# Patient Record
Sex: Male | Born: 1980 | Race: Black or African American | Hispanic: No | Marital: Single | Smoking: Never smoker
Health system: Southern US, Community
[De-identification: ages and names within clinical notes are randomized; demographics above are authoritative.]

## PROBLEM LIST (undated history)

## (undated) DIAGNOSIS — E7800 Pure hypercholesterolemia, unspecified: Secondary | ICD-10-CM

## (undated) DIAGNOSIS — R6250 Unspecified lack of expected normal physiological development in childhood: Secondary | ICD-10-CM

## (undated) DIAGNOSIS — J45909 Unspecified asthma, uncomplicated: Secondary | ICD-10-CM

## (undated) HISTORY — DX: Unspecified asthma, uncomplicated: J45.909

## (undated) HISTORY — PX: OTHER SURGICAL HISTORY: SHX169

## (undated) HISTORY — DX: Unspecified lack of expected normal physiological development in childhood: R62.50

## (undated) HISTORY — DX: Pure hypercholesterolemia, unspecified: E78.00

---

## 2001-11-18 ENCOUNTER — Ambulatory Visit (HOSPITAL_BASED_OUTPATIENT_CLINIC_OR_DEPARTMENT_OTHER): Admission: RE | Admit: 2001-11-18 | Discharge: 2001-11-18 | Payer: Self-pay | Admitting: Oral Surgery

## 2005-12-03 ENCOUNTER — Ambulatory Visit: Payer: Self-pay | Admitting: Internal Medicine

## 2005-12-31 ENCOUNTER — Ambulatory Visit: Payer: Self-pay | Admitting: Internal Medicine

## 2006-10-31 ENCOUNTER — Emergency Department (HOSPITAL_COMMUNITY): Admission: EM | Admit: 2006-10-31 | Discharge: 2006-10-31 | Payer: Self-pay | Admitting: Emergency Medicine

## 2007-05-21 ENCOUNTER — Ambulatory Visit (HOSPITAL_COMMUNITY): Admission: RE | Admit: 2007-05-21 | Discharge: 2007-05-21 | Payer: Self-pay | Admitting: General Surgery

## 2010-10-09 ENCOUNTER — Emergency Department (HOSPITAL_COMMUNITY)
Admission: EM | Admit: 2010-10-09 | Discharge: 2010-10-09 | Payer: Self-pay | Source: Home / Self Care | Admitting: Emergency Medicine

## 2010-10-16 ENCOUNTER — Other Ambulatory Visit: Payer: Self-pay | Admitting: Family Medicine

## 2010-10-16 DIAGNOSIS — R569 Unspecified convulsions: Secondary | ICD-10-CM

## 2010-10-16 DIAGNOSIS — R55 Syncope and collapse: Secondary | ICD-10-CM

## 2010-10-18 ENCOUNTER — Other Ambulatory Visit: Payer: Self-pay

## 2011-01-28 NOTE — Op Note (Signed)
NAMEDEAVEN, Aaron Campbell                ACCOUNT NO.:  [id number]   MEDICAL RECORD NO.:  [id number]          PATIENT TYPE:  AMB   LOCATION:  DAY                          FACILITY:  [facility]   PHYSICIAN:  Timothy E. Earlene Plater, M.D. DATE OF BIRTH:  Nov 17, 1980   DATE OF PROCEDURE:  05/21/2007  DATE OF DISCHARGE:                               OPERATIVE REPORT   PREOPERATIVE DIAGNOSIS:  Primary ventral hernia, umbilical hernia.   POSTOPERATIVE DIAGNOSIS:  Primary ventral hernia, umbilical hernia.   PROCEDURE:  Laparoscopic repair of ventral hernia with mesh.   SURGEON:  Timothy E. Earlene Plater, M.D.   ASSISTANT:  Alfonse Ras, MD   ANESTHESIA:  General.   Aaron Campbell is 42.  He is a Down syndrome patient, lives at home, takes  care of himself and works every day.  He is slightly overweight. He has  been complaining of abdominal pain and a significant ventral hernia was  diagnosed finally after several visits.  He has been carefully prepped,  his parents prepared.  They are ready to proceed at this time.  When  examined in the office it was thought he also had an umbilical hernia.   The patient was seen, identified and permit signed.   He was taken to the operating room and placed supine.  General  endotracheal anesthesia administered.  The arms were carefully padded  and tucked.  The abdomen was prepped in its entirety and draped as  sterile field.  I entered the abdominal cavity and the left upper  quadrant with a 10 mm scope and entered the abdominal cavity without  complications.  It was insufflated and general peritoneoscopy was  unremarkable except for a significant umbilical hernia and approximately  3-4 cm the ventral hernia with one large strand of omentum incarcerated.  A second 5 mm trocar was placed in the left lower quadrant and the  omentum was taken down sharply with cautery.  The hernia then could be  easily seen.  They were carefully measured by inserting spinal needles  through  the skin.  This was measured, marked on the outside skin and a  piece of 12 cm circular Ventralex mesh was chosen.  This was placed on  the abdominal wall seemed to estimate fit nicely.  Four sutures of  Prolene were placed in each quadrant of the mesh and then the mesh was  wetted.  It was then rolled up, placed into the abdominal cavity  unrolled and oriented.  The Prolene sutures were brought through the  extremities of the hernia with a suture placer and again carefully  oriented.  The sutures were all pulled up at the same time carrying the  mesh to the anterior abdominal wall and these were tied snugly.  Then  the tacking device was used to complete the mesh placement and the  tacker was used round about the circumference of the mesh.  We did have  to add additional 5 mm ports in the right lower and right upper  quadrants as well.  Mesh was in good position, seemed to be well placed.  This procedure was complete.  All instruments and  trocars removed.  Gas removed from the abdominal cavity.  Then each  wound inspected, closed where needed with interrupted suture.  Final  counts correct.  He tolerated it well, was taken recovery room in good  condition.  Written and verbal instructions given him and his family.  He will be followed in the office.      Timothy E. Earlene Plater, M.D.  Electronically Signed     TED/MEDQ  D:  05/21/2007  T:  05/21/2007  Job:  [id number]   cc:   [facility]

## 2011-01-31 NOTE — Discharge Summary (Signed)
. [facility]  Patient:    Aaron Campbell, Aaron Campbell Visit Number: [id number] MRN: [id number]          Service Type: DSU Location: [facility] Attending Physician:  Leonie Man Dictated by:   Dora Sims, M.D. Admit Date:  11/18/2001                             Discharge Summary  PREOPERATIVE DIAGNOSIS:  Impacted third molars, impacted unerupted tooth #22.  POSTOPERATIVE DIAGNOSIS:  Impacted third molars, impacted unerupted tooth #22.  PROCEDURE:  Surgical extraction of teeth #1, 16, and 17, exposure and bonding of tooth #22.  SURGEON:  Dora Sims, M.D.  ANESTHESIA:  General endotracheal tube anesthesia.  BRIEF HISTORY:  This is a Down syndrome patient with macroglossia.  Conscious IV sedation was attempted in the office setting and due to airway management, the decision was made to limit the surgical procedure at that time and defer for intubated general anesthesia.  DESCRIPTION OF PROCEDURE:  The patient was maintained NPO the night before surgery, was brought to the operating suite, placed in the supine position. All anesthesia monitors were found to be working appropriately.  The patient was orotracheally intubated with minimal difficulty, confirmed by clear bilateral breath sounds as well as positive end-tidal CO2.  The patient was appropriately padded.  He was then draped in a clean fashion for a dental procedure.  Approximately 8 cc of 2% lidocaine with 1:100,000 parts epinephrine were injected into the teeth #1 and 16 areas for infiltration and a mandibular block on the lower left to anesthetize teeth #17 and 22.  After adequate time for vasoconstriction was allowed, a 15 Bard Jimmey Ralph was used to release a distal flap elevation to expose teeth #1 and 16.  They were exposed, luxated, and delivered with forceps.  The follicles were removed with a rongeur.  Now the flaps were then closed with 3-0 chromic gut suture.  A distal release was also  performed in the #17 area.  A full-thickness mucoperiosteal flap was elevated, exposing the area of tooth #17.  The tooth was not visible.  An irrigating handpiece was then used to unroof the occlusal and buccal bone, then exposing the tooth.  The tooth was then sectioned and delivered in multiple pieces.  The socket was curetted, the follicle was removed and irrigated.  It was closed with 3-0 chromic gut suture as well. Attention was then focused at tooth #22.  A small envelope gingival flap was developed to expose the facial aspect of tooth #22.  It was etched, glass ionomer bonding material was then used to bond a stainless steel bracket and chain.  The chain was then cut to size, tied to the arch wire, and two interdental papillae sutures were placed with 3-0 chromic gut suture.  The throat pack that was placed at the beginning of the case was removed at this time.  There was minimal to no bleeding from the wisdom teeth sites, and minimal to no bleeding at the exposed bond site.  The patient was extubated, allowed to awake from a general anesthetic.  He will be maintained on a soft diet, followed up in my office.  No antibiotics will be given.  Hydrocodone will be used for pain management, and will be seen in my office for a normal postoperative visit. Dictated by:   Dora Sims, M.D. Attending Physician:  Leonie Man DD:  11/18/01 TD:  11/19/01 Job: [id number]

## 2011-06-27 LAB — COMPREHENSIVE METABOLIC PANEL
ALT: 21
AST: 18
Calcium: 9
Creatinine, Ser: 1
GFR calc Af Amer: >60
GFR calc non Af Amer: >60
Glucose, Bld: 89
Sodium: 139
Total Protein: 7.5

## 2011-06-27 LAB — CBC
MCHC: 33.9
MCV: 88.1
RDW: 15.6 — ABNORMAL HIGH

## 2011-06-27 LAB — DIFFERENTIAL
Eosinophils Absolute: 0.2
Lymphocytes Relative: 25
Lymphs Abs: 1.1
Monocytes Relative: 11
Neutrophils Relative %: 59

## 2013-10-19 ENCOUNTER — Ambulatory Visit (INDEPENDENT_AMBULATORY_CARE_PROVIDER_SITE_OTHER): Payer: Medicaid Other

## 2013-10-19 VITALS — BP 129/80 | HR 84 | Resp 12

## 2013-10-19 DIAGNOSIS — Q828 Other specified congenital malformations of skin: Secondary | ICD-10-CM

## 2013-10-19 DIAGNOSIS — B351 Tinea unguium: Secondary | ICD-10-CM

## 2013-10-19 DIAGNOSIS — M79609 Pain in unspecified limb: Secondary | ICD-10-CM

## 2013-10-19 MED ORDER — EFINACONAZOLE 10 % EX SOLN: CUTANEOUS | Status: DC

## 2013-10-19 NOTE — Progress Notes (Signed)
   Subjective:    Patient ID: Aaron Campbell, male    DOB: 05/16/1981, 33 y.o.   MRN: [id number]  HPI'' B/L TOENAILS ARE THICK AND HAVE FUNGUS FOR OVER 1 YEAR. TREATMENT TRIED NAFTIN IS NOT WORKING.''    Review of Systems  All other systems reviewed and are negative.       Objective:   Physical Exam Fluctuating objective findings as follows vascular status is intact with pedal pulses palpable DP postal for bilateral PT plus one over 4 bilateral. Patient is in generally dry skin and nails thick brittle crumbly discolored tender darkened and incurvated 1 through 5 bilateral hallux nails being the most severe. Nails are tender to touch and palpation. Patient also some flexible digital contractures and hammertoe deformities slightly plantigrade metatarsals subsecond third with a diffuse keratoses being noted as well. No punctate keratoses no open wounds ulcerations noted patient tried formula 3 in the past although did not follow through on this osseous Naftin cream for the chronic tinea pedis which seems to improved. At this time caregiver present with the patient is interested in a chair for alternative medication. Patient's stat medication cannot take oral antifungal medications due to drug interaction therefore the only option is to maintain a topical suggested the new Jublia medication.      Assessment & Plan:  Assessment onychomycosis painful mycotic dystrophic nails 1 through 5 bilateral debridement at this time also prescribed Jubilee a with instructions for use utilizing the [facility] pharmacy to provide a medication to. Followup in 3-4 months for continued palliative nail care and debridement as needed advised to stressed the importance of daily application the medication to each nail for 12 months duration  Alvan Dameichard Marvelle Caudill DPM

## 2013-10-19 NOTE — Patient Instructions (Signed)
Onychomycosis/Fungal Toenails  WHAT IS IT? An infection that lies within the keratin of your nail plate that is caused by a fungus.  WHY ME? Fungal infections affect all ages, sexes, races, and creeds.  There may be many factors that predispose you to a fungal infection such as age, coexisting medical conditions such as diabetes, or an autoimmune disease; stress, medications, fatigue, genetics, etc.  Bottom line: fungus thrives in a warm, moist environment and your shoes offer such a location.  IS IT CONTAGIOUS? Theoretically, yes.  You do not want to share shoes, nail clippers or files with someone who has fungal toenails.  Walking around barefoot in the same room or sleeping in the same bed is unlikely to transfer the organism.  It is important to realize, however, that fungus can spread easily from one nail to the next on the same foot.  HOW DO WE TREAT THIS?  There are several ways to treat this condition.  Treatment may depend on many factors such as age, medications, pregnancy, liver and kidney conditions, etc.  It is best to ask your doctor which options are available to you.  1. No treatment.   Unlike many other medical concerns, you can live with this condition.  However for many people this can be a painful condition and may lead to ingrown toenails or a bacterial infection.  It is recommended that you keep the nails cut short to help reduce the amount of fungal nail. 2. Topical treatment.  These range from herbal remedies to prescription strength nail lacquers.  About 40-50% effective, topicals require twice daily application for approximately 9 to 12 months or until an entirely new nail has grown out.  The most effective topicals are medical grade medications available through physicians offices. 3. Oral antifungal medications.  With an 80-90% cure rate, the most common oral medication requires 3 to 4 months of therapy and stays in your system for a year as the new nail grows out.  Oral  antifungal medications do require blood work to make sure it is a safe drug for you.  A liver function panel will be performed prior to starting the medication and after the first month of treatment.  It is important to have the blood work performed to avoid any harmful side effects.  In general, this medication safe but blood work is required. 4. Laser Therapy.  This treatment is performed by applying a specialized laser to the affected nail plate.  This therapy is noninvasive, fast, and non-painful.  It is not covered by insurance and is therefore, out of pocket.  The results have been very good with a 80-95% cure rate.  The [facility] is the only practice in the area to offer this therapy. 5. Permanent Nail Avulsion.  Removing the entire nail so that a new nail will not grow back.  Jublia topical nail antifungal solution as prescribed at this time. Instructions are to apply the Margette FastJubilee a to each affected toenails daily for 12 months. There are no shortcuts this has 3 applied every day to each nail as instructed discontinue only if any skin or nail irritation occur. He takes a minimum of 12 months to clear fungus in the nail.

## 2015-01-12 ENCOUNTER — Ambulatory Visit
Admission: RE | Admit: 2015-01-12 | Discharge: 2015-01-12 | Disposition: A | Discharge status: Home / Self Care | Payer: 59 | Source: Ambulatory Visit | Attending: Family Medicine | Admitting: Family Medicine

## 2015-01-12 ENCOUNTER — Other Ambulatory Visit: Payer: Self-pay | Admitting: Family Medicine

## 2015-01-12 DIAGNOSIS — R2689 Other abnormalities of gait and mobility: Secondary | ICD-10-CM

## 2015-01-18 ENCOUNTER — Other Ambulatory Visit: Payer: Self-pay | Admitting: Family Medicine

## 2015-01-18 DIAGNOSIS — R2689 Other abnormalities of gait and mobility: Secondary | ICD-10-CM

## 2015-02-16 ENCOUNTER — Other Ambulatory Visit: Payer: 59

## 2015-06-05 ENCOUNTER — Ambulatory Visit: Payer: 59 | Admitting: Physical Therapy

## 2015-06-11 ENCOUNTER — Ambulatory Visit: Payer: 59 | Admitting: Physical Therapy

## 2015-06-19 ENCOUNTER — Encounter: Payer: Self-pay | Admitting: Physical Therapy

## 2015-06-19 ENCOUNTER — Ambulatory Visit: Payer: 59 | Attending: Orthopedic Surgery | Admitting: Physical Therapy

## 2015-06-19 DIAGNOSIS — R262 Difficulty in walking, not elsewhere classified: Secondary | ICD-10-CM | POA: Insufficient documentation

## 2015-06-19 DIAGNOSIS — M5126 Other intervertebral disc displacement, lumbar region: Secondary | ICD-10-CM | POA: Diagnosis present

## 2015-06-19 DIAGNOSIS — R29898 Other symptoms and signs involving the musculoskeletal system: Secondary | ICD-10-CM | POA: Insufficient documentation

## 2015-06-19 NOTE — Patient Instructions (Signed)
Back Hyperextension: Using Arms   Lying face down with arms bent, inhale. Then while exhaling, straighten arms. Hold __2__ seconds. Let hips sag toward floor.  Slowly return to starting position. Repeat _10___ times per set. Do _2___ sets per session. Do __2__ sessions per day.  Back Namaste   Stand, feet in stride stance. Rotate back leg out about 20, keeping heel on floor. Stand, hands on low back. Press hips forward and arch back. Hold _2__ seconds. Repeat 10___ times per session. Do _2__ sessions per day.  Bracing With Bridging (Hook-Lying)   With neutral spine, tighten pelvic floor and abdominals and hold. Lift bottom. Repeat _10__ times. Do _2__ times a day.       

## 2015-06-19 NOTE — Therapy (Signed)
[facility] [address] [place], [place], [address] Phone: [phone]   Fax:  [phone]  Physical Therapy Evaluation  Patient Details  Name: Aaron Campbell MRN: [id number] Date of Birth: Apr 07, 1981 Referring Provider:  Venita Lick, MD  Encounter Date: 06/19/2015      PT End of Session - 06/19/15 1143    Visit Number 1   Date for PT Re-Evaluation 08/19/15   PT Start Time 1057   PT Stop Time 1143   PT Time Calculation (min) 46 min   Activity Tolerance Patient tolerated treatment well   Behavior During Therapy [facility] for tasks assessed/performed      Past Medical History  Diagnosis Date  . Asthma     History reviewed. No pertinent past surgical history.  There were no vitals filed for this visit.  Visit Diagnosis:  Right leg weakness - Plan: PT plan of care cert/re-cert  Difficulty walking - Plan: PT plan of care cert/re-cert  HNP (herniated nucleus pulposus), lumbar - Plan: PT plan of care cert/re-cert      Subjective Assessment - 06/19/15 1108    Subjective Mom reports that patient has been limping on the right for about a year.  Patient has Down's syndrome.  Mother is the primary historian, she reports that he has a high tolerance of pain and does not c/o pain.  She reports that he started to decrease the distance he walked due to c/o some pain   Limitations Walking   Diagnostic tests X-rays and MRI showed some disc bulging   Currently in Pain? Yes   Pain Score 4   difficulty getting, information from him   Pain Location Back   Pain Orientation Right   Pain Descriptors / Indicators Aching   Pain Type Chronic pain   Pain Onset More than a month ago   Pain Frequency Intermittent   Aggravating Factors  walking   Pain Relieving Factors rest   Effect of Pain on Daily Activities difficulty walking            [facility] PT Assessment - 06/19/15 0001    Assessment   Medical Diagnosis back and hip  pain, difficulty walking   Onset Date/Surgical Date 01/17/15   Prior Therapy n   Precautions   Precautions None   Balance Screen   Has the patient fallen in the past 6 months Yes   How many times? 2   Has the patient had a decrease in activity level because of a fear of falling?  No   Is the patient reluctant to leave their home because of a fear of falling?  No   Home Environment   Additional Comments stairs, some yardwork   Prior Function   Level of Independence Independent   Leisure walked for exercise up until 5 months ago   AROM   Overall AROM Comments decreased 25% for lumbar ROM, hip and knee ROM is WFL's   Strength   Overall Strength Comments right LE 3/5, left LE 4-/5   Palpation   Palpation comment significant mm mass loss on the right LE, circumferential measurements, right mid calf 37cm, left mid calf 41cm, right mid thigh 54 cm, left 59 cm                   [facility] Adult PT Treatment/Exercise - 06/19/15 0001    Ambulation/Gait   Gait Comments no device, slight right toe drag, does not bend the right knee, circumducts  the right leg                  PT Short Term Goals - 06/19/15 1148    PT SHORT TERM GOAL #1   Title independent with initial HEP   Time 2   Period Weeks   Status New           PT Long Term Goals - 06/19/15 1149    PT LONG TERM GOAL #1   Title increase right DF strength to 4/5   Time 8   Period Weeks   Status New   PT LONG TERM GOAL #2   Title walk 1 mile   Time 8   Period Weeks   Status New   PT LONG TERM GOAL #3   Title increase lumbar ROM 25%   Time 8   Period Weeks   Status New   PT LONG TERM GOAL #4   Title decreased circumduction of the right leg with gait   Time 8   Period Weeks   Status New   PT LONG TERM GOAL #5   Title increase strength of the right quad to 4/5   Time 8   Period Weeks   Status New               Plan - 06/19/15 1144    Clinical Impression Statement Patient with limping  for a year, with decreased ability to walk.  Mom reports to me that an MRI showed a bulging disc with a pinched nerve.  He has a slight foot drop on the right, with decreased knee flexion and with right hip circumduction.  The biggest thing that jumps out to me is pretty significant mm atrophy of the right calf and right thigh.  He does not c/o pain, the mom reports he has a high tolerance to pain.  He has Down's syndrome and the mom was mostly the historian for me.   Pt will benefit from skilled therapeutic intervention in order to improve on the following deficits Abnormal gait;Difficulty walking;Decreased range of motion;Decreased strength;Impaired flexibility;Pain   Rehab Potential Good   PT Frequency 2x / week   PT Duration 8 weeks   PT Treatment/Interventions Moist Heat;Electrical Stimulation;Gait training;Traction;Therapeutic activities;Therapeutic exercise;Manual techniques;Patient/family education;Passive range of motion   PT Next Visit Plan add exercises as tolerated   Consulted and Agree with Plan of Care Patient         Problem List There are no active problems to display for this patient.   Jearld Lesch., PT 06/19/2015, 12:05 PM  [facility] [address] [place], [place], [address] Phone: [phone]   Fax:  [phone]

## 2015-08-01 ENCOUNTER — Encounter: Payer: Self-pay | Admitting: Physical Therapy

## 2015-08-01 ENCOUNTER — Ambulatory Visit: Payer: 59 | Attending: Orthopedic Surgery | Admitting: Physical Therapy

## 2015-08-01 DIAGNOSIS — R262 Difficulty in walking, not elsewhere classified: Secondary | ICD-10-CM | POA: Diagnosis not present

## 2015-08-01 DIAGNOSIS — R29898 Other symptoms and signs involving the musculoskeletal system: Secondary | ICD-10-CM

## 2015-08-01 DIAGNOSIS — M5126 Other intervertebral disc displacement, lumbar region: Secondary | ICD-10-CM | POA: Diagnosis present

## 2015-08-01 NOTE — Therapy (Signed)
[facility] [address] [place], [place], [address] Phone: [phone]   Fax:  [phone]  Physical Therapy Treatment  Patient Details  Name: Aaron Campbell MRN: [id number] Date of Birth: 1981-08-13 No Data Recorded  Encounter Date: 08/01/2015      PT End of Session - 08/01/15 0925    Visit Number 2   Date for PT Re-Evaluation 08/19/15   PT Start Time 0848   PT Stop Time 0932   PT Time Calculation (min) 44 min      Past Medical History  Diagnosis Date  . Asthma     History reviewed. No pertinent past surgical history.  There were no vitals filed for this visit.  Visit Diagnosis:  Difficulty walking  HNP (herniated nucleus pulposus), lumbar  Right leg weakness      Subjective Assessment - 08/01/15 0849    Subjective Pt mom reports that he fell last week. Difficulty with stairs    Currently in Pain? No/denies   Pain Score 0-No pain                         [facility] Adult PT Treatment/Exercise - 08/01/15 0001    Exercises   Exercises Knee/Hip;Lumbar   Knee/Hip Exercises: Aerobic   Nustep NuStep L^ x 7 minutes    Knee/Hip Exercises: Machines for Strengthening   Cybex Knee Extension ##10 2x10    Cybex Leg Press #40 3x10   Knee/Hip Exercises: Seated   Long Arc Quad Both;2 sets;10 reps;Weights   Long Arc Quad Weight 3 lbs.   Marching 2 sets;Both;10 reps;Weights   Marching Weights 3 lbs.   Abduction/Adduction  2 sets;10 reps   Abd/Adduction Limitations ball squeezes, red Tband  2x15    Sit to Sand 10 reps;without UE support;2 sets  yellow weighted ball                   PT Short Term Goals - 06/19/15 1148    PT SHORT TERM GOAL #1   Title independent with initial HEP   Time 2   Period Weeks   Status New           PT Long Term Goals - 06/19/15 1149    PT LONG TERM GOAL #1   Title increase right DF strength to 4/5   Time 8   Period Weeks   Status New   PT  LONG TERM GOAL #2   Title walk 1 mile   Time 8   Period Weeks   Status New   PT LONG TERM GOAL #3   Title increase lumbar ROM 25%   Time 8   Period Weeks   Status New   PT LONG TERM GOAL #4   Title decreased circumduction of the right leg with gait   Time 8   Period Weeks   Status New   PT LONG TERM GOAL #5   Title increase strength of the right quad to 4/5   Time 8   Period Weeks   Status New               Plan - 08/01/15 0933    Clinical Impression Statement Pt progressed to to gym level exercises. Pt hypo verbal, mom does most of the talking. Mom reports that pt has difficulty going up ands down stairs. today's interventions focused on LE strengthening able to complete all interventions put cues for pacing.   Pt  will benefit from skilled therapeutic intervention in order to improve on the following deficits Abnormal gait;Difficulty walking;Decreased range of motion;Decreased strength;Impaired flexibility;Pain   Rehab Potential Good   PT Frequency 2x / week   PT Duration 8 weeks   PT Treatment/Interventions Moist Heat;Electrical Stimulation;Gait training;Traction;Therapeutic activities;Therapeutic exercise;Manual techniques;Patient/family education;Passive range of motion   PT Next Visit Plan add exercises as tolerated        Problem List There are no active problems to display for this patient.   Grayce Sessionsonald G Mirela Parsley, PTA  08/01/2015, 9:38 AM  [facility] [address] [place], [place], [address] Phone: [phone]   Fax:  [phone]  Name: Aaron Campbell MRN: [id number] Date of Birth: [birth year]

## 2015-08-07 ENCOUNTER — Encounter: Payer: Self-pay | Admitting: Physical Therapy

## 2015-08-07 ENCOUNTER — Ambulatory Visit: Payer: 59 | Admitting: Physical Therapy

## 2015-08-07 DIAGNOSIS — R262 Difficulty in walking, not elsewhere classified: Secondary | ICD-10-CM

## 2015-08-07 DIAGNOSIS — M5126 Other intervertebral disc displacement, lumbar region: Secondary | ICD-10-CM

## 2015-08-07 DIAGNOSIS — R29898 Other symptoms and signs involving the musculoskeletal system: Secondary | ICD-10-CM

## 2015-08-07 NOTE — Therapy (Signed)
[facility] [place] [facility] [address] [place], [place], [address] Phone: [phone]   Fax:  [phone]  Physical Therapy Treatment  Patient Details  Name: Aaron Campbell MRM: [id number] Date of Birth: 12-09-1980 No Data Recorded  Encounter Date: 08/07/2015      PT End of Session - 08/07/15 1058    Visit Number 3   Date for PT Re-Evaluation 08/19/15   PT Start Time 1024   PT Stop Time 1058   PT Time Calculation (min) 34 min      Past Medical History  Diagnosis Date  . Asthma     History reviewed. No pertinent past surgical history.  There were no vitals filed for this visit.  Visit Diagnosis:  HNP (herniated nucleus pulposus), lumbar  Right leg weakness  Difficulty walking      Subjective Assessment - 08/07/15 1024    Subjective Pt reports no problems   Currently in Pain? No/denies   Pain Score 0-No pain                         [facility] Adult PT Treatment/Exercise - 08/07/15 0001    Ambulation/Gait   Gait Comments Able to negotiate 3 flights of stairs 2 rails on descends one with ascends alternating pattern. Cues required to maintain alternating pattern with descending    Lumbar Exercises: Aerobic   Stationary Bike L0 x6 min    Knee/Hip Exercises: Machines for Strengthening   Cybex Knee Extension #15 2x10    Cybex Knee Flexion #25 2x10    Cybex Leg Press #40 3x10   Knee/Hip Exercises: Standing   Other Standing Knee Exercises Standing march #5 x10    Knee/Hip Exercises: Seated   Long Arc Quad Right;2 sets;10 reps;Weights   Long Arc Quad Weight 5 lbs.                  PT Short Term Goals - 08/07/15 1101    PT SHORT TERM GOAL #1   Title independent with initial HEP   Status On-going           PT Long Term Goals - 06/19/15 1149    PT LONG TERM GOAL #1   Title increase right DF strength to 4/5   Time 8   Period Weeks   Status New   PT LONG TERM GOAL #2   Title walk 1  mile   Time 8   Period Weeks   Status New   PT LONG TERM GOAL #3   Title increase lumbar ROM 25%   Time 8   Period Weeks   Status New   PT LONG TERM GOAL #4   Title decreased circumduction of the right leg with gait   Time 8   Period Weeks   Status New   PT LONG TERM GOAL #5   Title increase strength of the right quad to 4/5   Time 8   Period Weeks   Status New               Plan - 08/07/15 1058    Clinical Impression Statement Pt and his mom 9 minutes late for PT treatment. Pt able to progress with resistance and functional mobility. PT able to progress to stair negotiation with alternating pattern. Pt with downs syndrome and very flat affect. Answers questions and completes all interventions.    Pt will benefit from skilled therapeutic intervention in order to improve  on the following deficits Abnormal gait;Difficulty walking;Decreased range of motion;Decreased strength;Impaired flexibility;Pain   Rehab Potential Good   PT Frequency 2x / week   PT Duration 8 weeks   PT Treatment/Interventions Moist Heat;Electrical Stimulation;Gait training;Traction;Therapeutic activities;Therapeutic exercise;Manual techniques;Patient/family education;Passive range of motion   PT Next Visit Plan add exercises as tolerated        Problem List There are no active problems to display for this patient.   Grayce Sessionsonald G Elius Etheredge, PTA  08/07/2015, 11:02 AM  [facility] [place] [facility] [address] [place], [place], [address] Phone: [phone]   Fax:  [phone]  Name: Aaron Campbell MRN: [id number] Date of Birth: 1981-08-11

## 2015-08-14 ENCOUNTER — Ambulatory Visit: Payer: 59 | Admitting: Physical Therapy

## 2015-08-16 ENCOUNTER — Ambulatory Visit: Payer: 59 | Attending: Orthopedic Surgery | Admitting: Physical Therapy

## 2015-08-16 ENCOUNTER — Encounter: Payer: Self-pay | Admitting: Physical Therapy

## 2015-08-16 DIAGNOSIS — R29898 Other symptoms and signs involving the musculoskeletal system: Secondary | ICD-10-CM | POA: Diagnosis present

## 2015-08-16 DIAGNOSIS — R262 Difficulty in walking, not elsewhere classified: Secondary | ICD-10-CM | POA: Diagnosis present

## 2015-08-16 DIAGNOSIS — M5126 Other intervertebral disc displacement, lumbar region: Secondary | ICD-10-CM | POA: Diagnosis present

## 2015-08-16 NOTE — Therapy (Signed)
[facility] [place] [address] [place], [place], [address] Phone: [phone]   Fax:  [phone]  Physical Therapy Treatment  Patient Details  Name: Aaron Campbell MRN: [id number] Date of Birth: October 13, 1980 No Data Recorded  Encounter Date: 08/16/2015      PT End of Session - 08/16/15 1144    Visit Number 4   Date for PT Re-Evaluation 08/19/15   PT Start Time 1106   PT Stop Time 1145   PT Time Calculation (min) 39 min      Past Medical History  Diagnosis Date  . Asthma     History reviewed. No pertinent past surgical history.  There were no vitals filed for this visit.  Visit Diagnosis:  Right leg weakness  Difficulty walking  HNP (herniated nucleus pulposus), lumbar      Subjective Assessment - 08/16/15 1108    Subjective "Good"   Currently in Pain? No/denies   Pain Score 0-No pain                         [facility] Adult PT Treatment/Exercise - 08/16/15 0001    Knee/Hip Exercises: Aerobic   Nustep NuStep L4 x 7 minutes    Knee/Hip Exercises: Machines for Strengthening   Cybex Knee Extension #15 2x10, #5 x10 RLE only     Cybex Knee Flexion #25 2x10    Cybex Leg Press #40 3x10, RLE only no weight x10   Knee/Hip Exercises: Seated   Long Arc Quad Right;2 sets;15 reps;Weights   Long Arc Quad Weight 3 lbs.   Abduction/Adduction  2 sets;10 reps   Abd/Adduction Limitations ball squeezes, black Tband  2x15    Sit to Sand 10 reps;without UE support;2 sets  blue weighted ball                   PT Short Term Goals - 08/07/15 1101    PT SHORT TERM GOAL #1   Title independent with initial HEP   Status On-going           PT Long Term Goals - 06/19/15 1149    PT LONG TERM GOAL #1   Title increase right DF strength to 4/5   Time 8   Period Weeks   Status New   PT LONG TERM GOAL #2   Title walk 1 mile   Time 8   Period Weeks   Status New   PT LONG TERM GOAL #3   Title  increase lumbar ROM 25%   Time 8   Period Weeks   Status New   PT LONG TERM GOAL #4   Title decreased circumduction of the right leg with gait   Time 8   Period Weeks   Status New   PT LONG TERM GOAL #5   Title increase strength of the right quad to 4/5   Time 8   Period Weeks   Status New               Plan - 08/16/15 1145    Clinical Impression Statement Pt 6 minutes late for PT treatment. Mom not present for PT session, Pt has downs syndrome and hypo verbal. Pt reports that he is doing better. He is able to complete all interventions and reports that they are not hard. Today's session did  consist of additional  LE interventions isolating RLE.   Pt will benefit from skilled therapeutic intervention in order to  improve on the following deficits Abnormal gait;Difficulty walking;Decreased range of motion;Decreased strength;Impaired flexibility;Pain   Rehab Potential Good   PT Frequency 2x / week   PT Duration 8 weeks   PT Treatment/Interventions Moist Heat;Electrical Stimulation;Gait training;Traction;Therapeutic activities;Therapeutic exercise;Manual techniques;Patient/family education;Passive range of motion   PT Next Visit Plan add exercises as tolerated        Problem List There are no active problems to display for this patient.   Grayce Sessionsonald G Braylie Badami, PTA  08/16/2015, 11:49 AM  [facility] [address] [facility] [facility] [address] [place], [place], [address] Phone: [phone]   Fax:  [phone]  Name: Aaron Campbell MRN: [id number] Date of Birth: 1981-06-29

## 2015-09-04 ENCOUNTER — Encounter: Payer: Self-pay | Admitting: *Deleted

## 2015-09-04 ENCOUNTER — Encounter: Payer: Self-pay | Admitting: Neurology

## 2015-09-04 ENCOUNTER — Ambulatory Visit (INDEPENDENT_AMBULATORY_CARE_PROVIDER_SITE_OTHER): Payer: 59 | Admitting: Neurology

## 2015-09-04 VITALS — BP 105/72 | HR 79 | Ht 63.0 in | Wt 191.4 lb

## 2015-09-04 DIAGNOSIS — R258 Other abnormal involuntary movements: Secondary | ICD-10-CM | POA: Diagnosis not present

## 2015-09-04 DIAGNOSIS — F89 Unspecified disorder of psychological development: Secondary | ICD-10-CM | POA: Insufficient documentation

## 2015-09-04 DIAGNOSIS — R29898 Other symptoms and signs involving the musculoskeletal system: Secondary | ICD-10-CM | POA: Diagnosis not present

## 2015-09-04 DIAGNOSIS — G1221 Amyotrophic lateral sclerosis: Secondary | ICD-10-CM

## 2015-09-04 DIAGNOSIS — W19XXXA Unspecified fall, initial encounter: Secondary | ICD-10-CM

## 2015-09-04 DIAGNOSIS — Q909 Down syndrome, unspecified: Secondary | ICD-10-CM | POA: Insufficient documentation

## 2015-09-04 DIAGNOSIS — R270 Ataxia, unspecified: Secondary | ICD-10-CM | POA: Diagnosis not present

## 2015-09-04 DIAGNOSIS — G1229 Other motor neuron disease: Secondary | ICD-10-CM | POA: Insufficient documentation

## 2015-09-04 NOTE — Progress Notes (Signed)
[facility]    Provider:  Dr Lucia Gaskins Referring Provider: [name]* Primary Care Physician:  [facility]  CC:  Right leg limp  HPI:  Aaron Campbell is a 34 y.o. male here as a referral from [facility] and [name], HLD. Past medical history of Down's syndrome. Patient started limping over a year ago. The right leg. He also started sliding his right leg out and tripping over it. He was evaluated by orthopaedic surgery and referred here. Patient cannot communicate effectively, he has down syndrome and mother provides all information. He has a history of not complaining about pain even with severe symptoms such as visible lesions so mother says he has a high tolerance and when he limps he denies pain but he still may be in pain. He limps every day. He has fallen, he tripped on his leg because he dragged his right foot. There is weakness in the right leg as well. No inciting events. No trauma. Progressive. Started about a year ago. Does not complain about anything, no pain or low back pain but patient is not very communicative. When asked if he limps due to pain he says 'a litle bit". No changes in bowel or bladder. No other focal neurologic complaints.  Reviewed notes, labs and imaging from outside physicians, which showed: MRI of the right hip showed no signs a cyst or other osseous lesion in the region of the right hip. Probable tear of the anterior portion of the right acetabular labrum although this is not definite. MRI of the lumbar spine showed chronic peers defect on the right of L5 with grade 1 spondylolisthesis of L5 on S1. Mild disc bulge with mildly to moderately narrows the right are Raymond and mildly deforms the right L5 dorsal root ganglion otherwise unremarkable study.  Patient presented to [facility] orthopedics on 08/05/2015 for follow-up of her back. He has back pain, aching and weakness, current treatment includes  Robaxin, does not like to take it. Patient reports pain is 5 out of 10 in her mother. He had 2 visits of physical therapy. Exam noted a slight altered limp on the right side but no complaints of back pain. No loss of bowel or bladder control. Note stated no significant foraminal or neural compression or assessment. No radicular leg pain and no significant back pain. He was referred for neurology.  Review of Systems: Patient complains of symptoms per HPI as well as the following symptoms: Urination problems. Pertinent negatives per HPI. All others negative.   Social History   Social History  . Marital Status: Single    Spouse Name: N/A  . Number of Children: N/A  . Years of Education: N/A   Occupational History  . Disabled    Social History Main Topics  . Smoking status: Never Smoker   . Smokeless tobacco: Not on file  . Alcohol Use: 0.0 oz/week    0 Standard drinks or equivalent per week     Comment: 1 per week  . Drug Use: No  . Sexual Activity: Not on file   Other Topics Concern  . Not on file   Social History Narrative   Lives with parents   Caffeine use: Tea    Family History  Problem Relation Age of Onset  . Stroke Neg Hx   . Neuropathy Neg Hx     Past Medical History  Diagnosis Date  . Asthma   . Hypercholesteremia   . Developmental delay  Past Surgical History  Procedure Laterality Date  . No surgical history      Current Outpatient Prescriptions  Medication Sig Dispense Refill  . methocarbamol (ROBAXIN) 500 MG tablet Take 500 mg by mouth every 6 (six) hours as needed.  0  . doxycycline (VIBRA-TABS) 100 MG tablet Take 100 mg by mouth 2 (two) times daily. Reported on 09/04/2015    . Efinaconazole (JUBLIA) 10 % SOLN Apply 1 drop to each of the affected nails once daily for 12 months duration. (Patient not taking: Reported on 09/04/2015) 4 mL 11  . pravastatin (PRAVACHOL) 40 MG tablet Take 40 mg by mouth daily. Reported on 09/04/2015     No current  facility-administered medications for this visit.    Allergies as of 09/04/2015 - Review Complete 09/04/2015  Allergen Reaction Noted  . Sulfa antibiotics  10/19/2013    Vitals: BP 105/72 mmHg  Pulse 79  Ht  (1.6 m)  Wt 191 lb 6.4 oz (86.818 kg)  BMI 33.91 kg/m2  SpO2 98% Last Weight:  Wt Readings from Last 1 Encounters:  09/04/15 191 lb 6.4 oz (86.818 kg)   Last Height:   Ht Readings from Last 1 Encounters:  09/04/15  (1.6 m)   Physical exam: Exam: Gen: NAD, conversant, well nourised, obese, well groomed                     CV: RRR, no MRG. No Carotid Bruits. No peripheral edema, warm, nontender Eyes: Conjunctivae clear without exudates or hemorrhage  Neuro: Detailed Neurologic Exam  Speech:    No dysarthria or aphasia  Cognition: follows all commands.     The patient is oriented to person, date, day, not year     recent and remote memory impaired;     language fluent;     Impaired attention, concentration,     fund of knowledge impaired Cranial Nerves:    The pupils are equal, round, and reactive to light. Characteristic faces of Down syndrome. The fundi are flat. Visual fields are full to finger confrontation. Extraocular movements are intact. Trigeminal sensation is intact and the muscles of mastication are normal. The face is symmetric. The palate elevates in the midline. Hearing intact. Voice is normal. Shoulder shrug is normal. The tongue has normal motion without fasciculations.   Coordination:    Normal finger to nose and heel to shin.   Gait:    Wide based, mildly antalgic, mild circumduction  Motor Observation:    No asymmetry, no atrophy, and no involuntary movements noted. Tone:    Mildly increased spasticity right leg.    Posture:    Posture is normal. normal erect    Strength: difficult strength exam due to cognitive problems however right leg appears weaker in hip flexion, leg flexion with minimal DF weakness.      Sensation: intact  to LT     Reflex Exam:  DTR's:    Deep tendon reflexes in the upper and lower extremities are brisk bilaterally.   Toes:    Right upgoing. Left equivocal.  Clonus:    Clonus right ankle jerk.       Assessment/Plan:  34 year old male with Down syndrome here for her progressive right leg weakness. Unclear if patient is experiencing any pain due to cognition and difficulties with communication. He does have spasticity in the right leg with an upgoing toe and clonus at the ankle jerk. We'll order a nerve conduction study of the bilateral lower extremities  and MRIs of the brain and cervical spine to further evaluate.  NCS/EMG bilateral lower extremities MRI of the brain and cervical spine CMP   Naomie DeanAntonia Amanuel Sinkfield, MD  [facility] [address] [place], [place] [address]  Phone [phone] Fax [phone]

## 2015-09-04 NOTE — Patient Instructions (Signed)
Overall you are doing fairly well but I do want to suggest a few things today:   Remember to drink plenty of fluid, eat healthy meals and do not skip any meals. Try to eat protein with a every meal and eat a healthy snack such as fruit or nuts in between meals. Try to keep a regular sleep-wake schedule and try to exercise daily, particularly in the form of walking, 20-30 minutes a day, if you can.   As far as diagnostic testing: emg/ncs and imaging of the brain and lab  I would like to see you back for emg/ncs, sooner if we need to. Please call us with any interim questions, concerns, problems, updates or refill requests.   Our phone number is [phone]. We also have an after hours call service for urgent matters and there is a physician on-call for urgent questions. For any emergencies you know to call 911 or go to the nearest emergency room

## 2015-09-05 ENCOUNTER — Telehealth: Payer: Self-pay | Admitting: *Deleted

## 2015-09-05 LAB — COMPREHENSIVE METABOLIC PANEL
A/G RATIO: 1.4 (ref 1.1–2.5)
ALBUMIN: 4.2 g/dL (ref 3.5–5.5)
ALK PHOS: 56 IU/L (ref 39–117)
ALT: 19 IU/L (ref 0–44)
AST: 18 IU/L (ref 0–40)
BUN / CREAT RATIO: 12 (ref 8–19)
BUN: 11 mg/dL (ref 6–20)
Bilirubin Total: 0.6 mg/dL (ref 0.0–1.2)
CO2: 23 mmol/L (ref 18–29)
CREATININE: 0.92 mg/dL (ref 0.76–1.27)
Calcium: 8.9 mg/dL (ref 8.7–10.2)
Chloride: 101 mmol/L (ref 96–106)
GFR calc Af Amer: 126 mL/min/{1.73_m2} (ref >59)
GFR, EST NON AFRICAN AMERICAN: 109 mL/min/{1.73_m2} (ref >59)
GLOBULIN, TOTAL: 3 g/dL (ref 1.5–4.5)
Glucose: 81 mg/dL (ref 65–99)
POTASSIUM: 3.8 mmol/L (ref 3.5–5.2)
SODIUM: 141 mmol/L (ref 134–144)
Total Protein: 7.2 g/dL (ref 6.0–8.5)

## 2015-09-05 NOTE — Telephone Encounter (Signed)
Spoke to mother about normal labs. Ok per [employer]. She verbalized understanding.

## 2015-09-05 NOTE — Telephone Encounter (Signed)
-----   Message from Anson FretAntonia B Ahern, MD sent at 09/05/2015  7:36 AM EST ----- Labs normal, thanks

## 2015-09-24 ENCOUNTER — Ambulatory Visit
Admission: RE | Admit: 2015-09-24 | Discharge: 2015-09-24 | Disposition: A | Discharge status: Home / Self Care | Payer: 59 | Source: Ambulatory Visit | Attending: Neurology | Admitting: Neurology

## 2015-09-24 DIAGNOSIS — R258 Other abnormal involuntary movements: Secondary | ICD-10-CM

## 2015-09-24 DIAGNOSIS — W19XXXA Unspecified fall, initial encounter: Secondary | ICD-10-CM

## 2015-09-24 DIAGNOSIS — R29898 Other symptoms and signs involving the musculoskeletal system: Secondary | ICD-10-CM

## 2015-09-24 DIAGNOSIS — R270 Ataxia, unspecified: Secondary | ICD-10-CM

## 2015-09-24 DIAGNOSIS — G1221 Amyotrophic lateral sclerosis: Secondary | ICD-10-CM | POA: Diagnosis not present

## 2015-09-24 DIAGNOSIS — G1229 Other motor neuron disease: Secondary | ICD-10-CM

## 2015-09-24 MED ORDER — GADOBENATE DIMEGLUMINE 529 MG/ML IV SOLN
17.0000 mL | Freq: Once | INTRAVENOUS | Status: AC | PRN
  Administered 2015-09-24: 17 mL via INTRAVENOUS

## 2015-09-26 ENCOUNTER — Ambulatory Visit (INDEPENDENT_AMBULATORY_CARE_PROVIDER_SITE_OTHER): Payer: 59 | Admitting: Neurology

## 2015-09-26 ENCOUNTER — Ambulatory Visit (INDEPENDENT_AMBULATORY_CARE_PROVIDER_SITE_OTHER): Payer: Self-pay | Admitting: Neurology

## 2015-09-26 DIAGNOSIS — R270 Ataxia, unspecified: Secondary | ICD-10-CM

## 2015-09-26 DIAGNOSIS — R29898 Other symptoms and signs involving the musculoskeletal system: Secondary | ICD-10-CM

## 2015-09-26 DIAGNOSIS — Z0289 Encounter for other administrative examinations: Secondary | ICD-10-CM

## 2015-09-26 DIAGNOSIS — G992 Myelopathy in diseases classified elsewhere: Secondary | ICD-10-CM

## 2015-09-26 DIAGNOSIS — M4712 Other spondylosis with myelopathy, cervical region: Secondary | ICD-10-CM | POA: Diagnosis not present

## 2015-09-26 DIAGNOSIS — M4802 Spinal stenosis, cervical region: Principal | ICD-10-CM

## 2015-09-26 DIAGNOSIS — G1229 Other motor neuron disease: Secondary | ICD-10-CM

## 2015-09-26 DIAGNOSIS — R258 Other abnormal involuntary movements: Secondary | ICD-10-CM

## 2015-09-26 DIAGNOSIS — G1221 Amyotrophic lateral sclerosis: Secondary | ICD-10-CM

## 2015-09-26 DIAGNOSIS — W19XXXA Unspecified fall, initial encounter: Secondary | ICD-10-CM

## 2015-09-26 NOTE — Progress Notes (Signed)
See procedure note.

## 2015-09-26 NOTE — Progress Notes (Signed)
[facility]    Provider:  Dr Aaron Campbell Referring Provider: [facility]* Primary Care Physician:  Aaron Campbell  history: Aaron Campbell is a 35 y.o. male here as a referral from Aaron Campbell and Aaron Campbell, HLD. Past medical history of Down's syndrome. Patient started limping over a year ago. The right leg. He also started sliding his right leg out and tripping over it. He was evaluated by orthopaedic surgery and referred here. Patient cannot communicate effectively, he has down syndrome and mother provides all information. He has a history of not complaining about pain even with severe symptoms such as visible lesions so mother says he has a high tolerance and when he limps he denies pain but he still may be in pain. He limps every day. He has fallen, he tripped on his leg because he dragged his right foot. There is weakness in the right leg as well. No inciting events. No trauma. Progressive. Started about a year ago. Does not complain about anything, no pain or low back pain but patient is not very communicative. When asked if he limps due to pain he says 'a litle bit". No changes in bowel or bladder. No other focal neurologic complaints.  Reviewed notes, labs and imaging from outside physicians, which showed: MRI of the right hip showed no signs a cyst or other osseous lesion in the region of the right hip. Probable tear of the anterior portion of the right acetabular labrum although this is not definite. MRI of the lumbar spine showed chronic peers defect on the right of L5 with grade 1 spondylolisthesis of L5 on S1. Mild disc bulge with mildly to moderately narrows the right are Raymond and mildly deforms the right L5 dorsal root ganglion otherwise unremarkable study.  [facility] to [facility] on 08/05/2015 for follow-up of her back. He has back pain, aching and weakness, current treatment includes Robaxin, does not like to  take it. Patient reports pain is 5 out of 10 in her mother. He had 2 visits of physical therapy. Exam noted a slight altered limp on the right side but no complaints of back pain. No loss of bowel or bladder control. Note stated no significant foraminal or neural compression or assessment. No radicular leg pain and no significant back pain. He was referred for neurology.  Reviewed MRI of the brain and cervical cord with mother. MRi of the brain unremarkable, MRI of the cervical spine as follows with referral to [facility]:  This MRI of the cervical spine with and without contrast shows the following: 1. 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 Myelopathic signal is noted below and above the point of maximum stenosis. There is moderate bilateral foraminal narrowing but no definite nerve root compression. 2. Left paramedian disc protrusion at C4-C5 causing mild to moderate left foraminal narrowing but no nerve root compression. 3. Right paramedian disc herniation at C5-C6 distorting the thecal sac but not causing any spinal cord myelopathic change.   Summary  Nerve conduction studies were performed on the bilateral lower extremities:  The bilateral Peroneal motor nerves showed normal conductions with normal F Wave latency The bilateral Tibial motor nerves showed normal conductions with normal F Wave latency The bilateral Superficial peroneal sensory nerves were within normal limits The bilateral Sural sensory nerves were within normal limits Bilateral H Reflexes showed normal latencies  EMG Needle study was performed on selected right lower extremity muscles:   The  right Gluteus Maximus and Medius, right Biceps Femoris (long head). right Vastus Medialis, right Anterior Tibialis, right Medial Gastrocnemius, right Extensor Hallucis Longus muscles and right L5/S1 paraspinals were within normal limits.   Conclusion: This is a normal  study. No electrophysiologic evidence for peripheral polyneuropathy or lumbar radiculopathy.  Aaron Dean, MD  [facility] [address] [place], [place] [address]  Phone [phone] Fax [phone]

## 2015-09-26 NOTE — Procedures (Signed)
[facility]    Provider:  Dr Lucia Gaskins Referring Provider: [name]* Primary Care Physician:  [name]  history: Aaron Campbell is a 35 y.o. male here as a referral from Aaron Campbell and Aaron. Shon Campbell, HLD. Past medical history of Down's syndrome. Patient started limping over a year ago. The right leg. He also started sliding his right leg out and tripping over it. He was evaluated by orthopaedic surgery and referred here. Patient cannot communicate effectively, he has down syndrome and mother provides all information. He has a history of not complaining about pain even with severe symptoms such as visible lesions so mother says he has a high tolerance and when he limps he denies pain but he still may be in pain. He limps every day. He has fallen, he tripped on his leg because he dragged his right foot. There is weakness in the right leg as well. No inciting events. No trauma. Progressive. Started about a year ago. Does not complain about anything, no pain or low back pain but patient is not very communicative. When asked if he limps due to pain he says 'a litle bit". No changes in bowel or bladder. No other focal neurologic complaints.  Reviewed notes, labs and imaging from outside physicians, which showed: MRI of the right hip showed no signs a cyst or other osseous lesion in the region of the right hip. Probable tear of the anterior portion of the right acetabular labrum although this is not definite. MRI of the lumbar spine showed chronic peers defect on the right of L5 with grade 1 spondylolisthesis of L5 on S1. Mild disc bulge with mildly to moderately narrows the right are Raymond and mildly deforms the right L5 dorsal root ganglion otherwise unremarkable study.  Patient presented to [name] orthopedics on 08/05/2015 for follow-up of her back. He has back pain, aching and weakness, current treatment includes Robaxin, does not like to  take it. Patient reports pain is 5 out of 10 in her mother. He had 2 visits of physical therapy. Exam noted a slight altered limp on the right side but no complaints of back pain. No loss of bowel or bladder control. Note stated no significant foraminal or neural compression or assessment. No radicular leg pain and no significant back pain. He was referred for neurology.  Reviewed MRI of the brain and cervical cord with mother. MRi of the brain unremarkable, MRI of the cervical spine as follows with referral to [facility]:  This MRI of the cervical spine with and without contrast shows the following: 1. 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 Myelopathic signal is noted below and above the point of maximum stenosis. There is moderate bilateral foraminal narrowing but no definite nerve root compression. 2. Left paramedian disc protrusion at C4-C5 causing mild to moderate left foraminal narrowing but no nerve root compression. 3. Right paramedian disc herniation at C5-C6 distorting the thecal sac but not causing any spinal cord myelopathic change.   Summary  Nerve conduction studies were performed on the bilateral lower extremities:  The bilateral Peroneal motor nerves showed normal conductions with normal F Wave latency The bilateral Tibial motor nerves showed normal conductions with normal F Wave latency The bilateral Superficial peroneal sensory nerves were within normal limits The bilateral Sural sensory nerves were within normal limits Bilateral H Reflexes showed normal latencies  EMG Needle study was performed on selected right lower extremity muscles:   The  right Gluteus Maximus and Medius, right Biceps Femoris (long head). right Vastus Medialis, right Anterior Tibialis, right Medial Gastrocnemius, right Extensor Hallucis Longus muscles and right L5/S1 paraspinals were within normal limits.   Conclusion: This is a normal  study. No electrophysiologic evidence for peripheral polyneuropathy or lumbar radiculopathy.  Aaron DeanAntonia Courage Biglow, MD  [facility] [address] [place], [place] [address]  Phone [phone] Fax [phone]

## 2015-09-28 ENCOUNTER — Telehealth: Payer: Self-pay | Admitting: Neurology

## 2015-09-28 NOTE — Telephone Encounter (Signed)
Pt mother called said [facility] called her and said pt could not be seen there but did not give a reason why. She was told he could go to Dr Lucia GaskinsAhern or Dr Shon BatonBrooks. She asked why and the receptionist told her Dr Murray HodgkinsBartko did not put a note in for the reason. She is confused. Please call and advise where he should go.

## 2015-10-01 ENCOUNTER — Other Ambulatory Visit: Payer: Self-pay | Admitting: Neurology

## 2015-10-01 DIAGNOSIS — M4802 Spinal stenosis, cervical region: Principal | ICD-10-CM

## 2015-10-01 DIAGNOSIS — G992 Myelopathy in diseases classified elsewhere: Secondary | ICD-10-CM

## 2015-10-01 NOTE — Telephone Encounter (Signed)
Patient was sent for referral to [facility] for c3/c4 stenosis for surgical eval. Dr. Murray HodgkinsBartko who is pain declined the referral, but he is not a surgeon so I think it erroneously got sent for pain and not for surgery. I spoke to Lupita LeashDonna today who said to call back tomorrow and speak to Marylene LandAngela or Thayer OhmChris about neurosurgery appoint, NOT PAIN. 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 Can you call mother and explain what we think happened and we are going to work with the scheduler to get them an appointment at [facility] with a Careers advisersurgeon.

## 2015-10-01 NOTE — Telephone Encounter (Signed)
Called and spoke to patient's mother and relayed it was Dr. Jeral FruitBotero that denied the referral . I relayed to patient's mother I will call and speak to [facility] at Dr. Cassandria SanteeBotero's office and find out some more details. Patient's mother was was fine with this.

## 2015-10-02 NOTE — Telephone Encounter (Signed)
Mother Steward Drone called to check status of referral, please call [phone].

## 2015-10-02 NOTE — Telephone Encounter (Signed)
[facility], any word on this? Why it was declined?

## 2015-10-03 NOTE — Telephone Encounter (Signed)
I called [facility] and left her a message asking her to call me about decline of Botero  not being ing  Able to see Patient . I have submitted another referral to see if any one else can see him. Waiting on response from [facility] back.   Called mother and left her a voice mail I will call [facility] when there office open to get more answers so we can move forward.

## 2015-10-04 NOTE — Telephone Encounter (Signed)
[facility] would you call mom and tell her she can't go back to [facility] because she had too many no shows in the past. [name] is a Tax adviser and she should follow up with. If she sees [name] and would like a second opinion then we can send her to [facility] thank you.

## 2015-10-08 NOTE — Telephone Encounter (Signed)
Patient's mother wants to pick up her son's records and she wants them today I relayed to patient's mother a release would have to be signed. Spoke to Stanton Kidney she will get records ready for mother to pick up. Patient's mother was fine with this.

## 2015-10-08 NOTE — Telephone Encounter (Signed)
Pt's mother returned Dana's call

## 2015-10-08 NOTE — Telephone Encounter (Signed)
Called and left patients mother a detailed message about not be able to go back to [place] , Relayed keep apt's with [name] and we can refer patient to [facility] if she would like. Relayed to patient's mother to please call me back with any questions or concerns.

## 2015-10-29 ENCOUNTER — Other Ambulatory Visit: Payer: Self-pay | Admitting: Orthopaedic Surgery

## 2015-10-29 DIAGNOSIS — G9589 Other specified diseases of spinal cord: Secondary | ICD-10-CM

## 2015-10-29 DIAGNOSIS — G959 Disease of spinal cord, unspecified: Secondary | ICD-10-CM

## 2015-10-29 DIAGNOSIS — M4712 Other spondylosis with myelopathy, cervical region: Secondary | ICD-10-CM

## 2015-11-01 ENCOUNTER — Ambulatory Visit
Admission: RE | Admit: 2015-11-01 | Discharge: 2015-11-01 | Disposition: A | Discharge status: Home / Self Care | Payer: 59 | Source: Ambulatory Visit | Attending: Orthopaedic Surgery | Admitting: Orthopaedic Surgery

## 2015-11-01 DIAGNOSIS — G959 Disease of spinal cord, unspecified: Secondary | ICD-10-CM

## 2015-11-01 DIAGNOSIS — M4712 Other spondylosis with myelopathy, cervical region: Secondary | ICD-10-CM

## 2015-11-01 DIAGNOSIS — G9589 Other specified diseases of spinal cord: Secondary | ICD-10-CM

## 2016-02-08 ENCOUNTER — Ambulatory Visit: Payer: 59 | Admitting: Physical Therapy

## 2016-02-12 ENCOUNTER — Encounter: Payer: Self-pay | Admitting: Physical Therapy

## 2016-02-12 ENCOUNTER — Ambulatory Visit: Payer: 59 | Attending: Orthopaedic Surgery | Admitting: Physical Therapy

## 2016-02-12 DIAGNOSIS — M6281 Muscle weakness (generalized): Secondary | ICD-10-CM

## 2016-02-12 DIAGNOSIS — R262 Difficulty in walking, not elsewhere classified: Secondary | ICD-10-CM | POA: Insufficient documentation

## 2016-02-12 NOTE — Therapy (Signed)
[facility] [place] [facility] [address] [place], [place], [address] Phone: [phone]   Fax:  [phone]  Physical Therapy Evaluation  Patient Details  Name: [facility] MRN: [id number] Date of Birth: Aug 15, 1981 Referring Provider: Rosezetta Schlatter  Encounter Date: 02/12/2016      PT End of Session - 02/12/16 0955    Visit Number 1   Date for PT Re-Evaluation 04/13/16   PT Start Time 0933   PT Stop Time 1018   PT Time Calculation (min) 45 min   Activity Tolerance Patient tolerated treatment well   Behavior During Therapy [facility] for tasks assessed/performed      Past Medical History  Diagnosis Date  . Asthma   . Hypercholesteremia   . Developmental delay     Past Surgical History  Procedure Laterality Date  . No surgical history      There were no vitals filed for this visit.       Subjective Assessment - 02/12/16 0937    Subjective Patient underwent ACDF C3-6 on 12/12/15.  Patient has Down's Syndrome so he is not a good historian, his mom is present with him.   Currently in Pain? Yes   Pain Score 2    Pain Location Neck   Pain Orientation Lower   Pain Descriptors / Indicators Aching;Spasm   Pain Type Surgical pain   Pain Onset More than a month ago   Pain Frequency Intermittent   Aggravating Factors  turning head , head motions, up to 6/10   Pain Relieving Factors rest            [facility] PT Assessment - 02/12/16 0001    Assessment   Medical Diagnosis cervical ACDF C3-6 , difficulty walking   Referring Provider Rosezetta Schlatter   Onset Date/Surgical Date 12/12/15   Prior Therapy for back   Precautions   Precautions None   Balance Screen   Has the patient fallen in the past 6 months Yes   How many times? 2   Has the patient had a decrease in activity level because of a fear of falling?  No   Is the patient reluctant to leave their home because of a fear of falling?  No   Home Environment   Additional  Comments stairs, some yardwork   Prior Function   Level of Independence Independent   Vocation On disability   Leisure has not done any exercise   AROM   Overall AROM Comments cervcial ROM is decreaesd 50%, shoulder ROM is WFL's   Strength   Overall Strength Comments UE strength is 4-/5 , LE's 4-/5, right hip abduction is 3/5 very weak for abduction, right ankle EF is 3/5   Palpation   Palpation comment he is tight in the upper traps and cervical parapsinals, he does not c/o pain much, he has a large knot in the left anterior neck and throat area, above the scar, mom thinks in may be a boil, but this is very large.   Ambulation/Gait   Gait Comments no device, slow gait, he has a significant trendelenberg gait on the right, with a mildly stiff right knee                   [facility] Adult PT Treatment/Exercise - 02/12/16 0001    Lumbar Exercises: Aerobic   Stationary Bike Nustep Level 4x 5 minutes   UBE (Upper Arm Bike) Level 4 x 4 minutes4   Lumbar Exercises: Machines for  Strengthening   Cybex Knee Extension 5# 2x10   Cybex Knee Flexion 20# 2x10   Other Lumbar Machine Exercise seated row 15#, lats 15# 2x10 each                  PT Short Term Goals - 02/12/16 16100958    PT SHORT TERM GOAL #1   Title independent with initial HEP   Time 2   Status New           PT Long Term Goals - 02/12/16 [id number]    PT LONG TERM GOAL #1   Title increase right DF strength to 4/5   Time 8   Period Weeks   Status New   PT LONG TERM GOAL #2   Title walk 1 mile   Time 8   Period Weeks   Status New   PT LONG TERM GOAL #3   Title increase cervical ROM 25%   Time 8   Status New   PT LONG TERM GOAL #4   Title decrease trendelenberg gait pattern   Time 8   Period Weeks   Status New   PT LONG TERM GOAL #5   Title increase strength of the right quad to 4/5hip to 4/5   Time 8   Period Weeks   Status New               Plan - 02/12/16 0955    Clinical Impression  Statement Patient with problems walking over the past year, he underwent a 3 level ACDF on 12/12/15.  He denies pain, but he is not vocal with any questions.  His right hip is very weak, his cervical ROM is decreased 50%   Rehab Potential Good   PT Frequency 3x / week   PT Duration 8 weeks   PT Treatment/Interventions Moist Heat;Electrical Stimulation;Gait training;Therapeutic activities;Therapeutic exercise;Manual techniques;Patient/family education;Passive range of motion;ADLs/Self Care Home Management   PT Next Visit Plan slowly add exercises and balance   Consulted and Agree with Plan of Care Patient      Patient will benefit from skilled therapeutic intervention in order to improve the following deficits and impairments:  Abnormal gait, Difficulty walking, Decreased range of motion, Decreased strength, Impaired flexibility, Pain  Visit Diagnosis: Difficulty in walking, not elsewhere classified - Plan: PT plan of care cert/re-cert  Muscle weakness (generalized) - Plan: PT plan of care cert/re-cert     Problem List Patient Active Problem List   Diagnosis Date Noted  . 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 Right leg weakness 09/04/2015  . Clonus 09/04/2015  . Down syndrome 09/04/2015  . Developmental disability 09/04/2015    Jearld LeschALBRIGHT,Izora Benn W., PT 02/12/2016, 10:14 AM  [facility] [address] [place] [address] [place], [place], [address] Phone: [phone]   Fax:  [phone]  Name: [facility] MRN: [id number] Date of Birth: [birth year]

## 2016-02-18 ENCOUNTER — Encounter: Payer: Self-pay | Admitting: Physical Therapy

## 2016-02-18 ENCOUNTER — Ambulatory Visit: Payer: 59 | Attending: Orthopaedic Surgery | Admitting: Physical Therapy

## 2016-02-18 DIAGNOSIS — R29898 Other symptoms and signs involving the musculoskeletal system: Secondary | ICD-10-CM | POA: Diagnosis present

## 2016-02-18 DIAGNOSIS — M6281 Muscle weakness (generalized): Secondary | ICD-10-CM | POA: Diagnosis present

## 2016-02-18 DIAGNOSIS — R262 Difficulty in walking, not elsewhere classified: Secondary | ICD-10-CM | POA: Insufficient documentation

## 2016-02-18 NOTE — Therapy (Signed)
[facility] [place] [facility] [address] [place], [place], [address] Phone: [phone]   Fax:  [phone]  Physical Therapy Treatment  Patient Details  Name: Riaz Onorato MRN: [id number] Date of Birth: 11-Apr-1981 Referring Provider: Rosezetta Schlatter  Encounter Date: 02/18/2016      PT End of Session - 02/18/16 1523    Visit Number 2   Date for PT Re-Evaluation 04/13/16   PT Start Time 1450   PT Stop Time 1530   PT Time Calculation (min) 40 min   Activity Tolerance Patient tolerated treatment well   Behavior During Therapy [facility] for tasks assessed/performed      Past Medical History  Diagnosis Date  . Asthma   . Hypercholesteremia   . Developmental delay     Past Surgical History  Procedure Laterality Date  . No surgical history      There were no vitals filed for this visit.      Subjective Assessment - 02/18/16 1454    Subjective No c/o after the first treatment.   Currently in Pain? Yes   Pain Score 3    Pain Location Neck   Pain Orientation Lower                         [facility] Adult PT Treatment/Exercise - 02/18/16 0001    High Level Balance   High Level Balance Comments all kicks switching feet   Lumbar Exercises: Aerobic   Stationary Bike Nustep Level 4x 5 minutes   UBE (Upper Arm Bike) Level 4 x 4 minutes4   Lumbar Exercises: Machines for Strengthening   Cybex Knee Extension 5# 2x10   Cybex Knee Flexion 20# 2x10   Other Lumbar Machine Exercise seated row 15#, lats 15# 2x10 each   Knee/Hip Exercises: Machines for Strengthening   Cybex Leg Press 40# 2x10   Knee/Hip Exercises: Standing   Other Standing Knee Exercises weighted ball over head lift 2x10, seated obliques with weighted ball 2x10                  PT Short Term Goals - 02/12/16 5284    PT SHORT TERM GOAL #1   Title independent with initial HEP   Time 2   Status New           PT Long Term Goals - 02/12/16 1324     PT LONG TERM GOAL #1   Title increase right DF strength to 4/5   Time 8   Period Weeks   Status New   PT LONG TERM GOAL #2   Title walk 1 mile   Time 8   Period Weeks   Status New   PT LONG TERM GOAL #3   Title increase cervical ROM 25%   Time 8   Status New   PT LONG TERM GOAL #4   Title decrease trendelenberg gait pattern   Time 8   Period Weeks   Status New   PT LONG TERM GOAL #5   Title increase strength of the right quad to 4/5hip to 4/5   Time 8   Period Weeks   Status New               Plan - 02/18/16 1523    Clinical Impression Statement Patient with good effort for all activities, he continues to limp significantly on the right, right LE is weaker than the left   PT Next Visit Plan slowly  add exercises and balance   Consulted and Agree with Plan of Care Patient      Patient will benefit from skilled therapeutic intervention in order to improve the following deficits and impairments:  Abnormal gait, Difficulty walking, Decreased range of motion, Decreased strength, Impaired flexibility, Pain  Visit Diagnosis: Difficulty in walking, not elsewhere classified  Muscle weakness (generalized)     Problem List Patient Active Problem List   Diagnosis Date Noted  . 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 Right leg weakness 09/04/2015  . Clonus 09/04/2015  . Down syndrome 09/04/2015  . Developmental disability 09/04/2015    Jearld LeschALBRIGHT,MICHAEL W., PT 02/18/2016, 3:28 PM  [facility] [place] [address] [place], [place], [address] Phone: [phone]   Fax:  [phone]  Name: Lilyan Gilfordsaac Wellborn MRN: [id number] Date of Birth: 1981-02-16

## 2016-02-20 ENCOUNTER — Encounter: Payer: Self-pay | Admitting: Physical Therapy

## 2016-02-20 ENCOUNTER — Ambulatory Visit: Payer: 59 | Admitting: Physical Therapy

## 2016-02-20 DIAGNOSIS — M6281 Muscle weakness (generalized): Secondary | ICD-10-CM

## 2016-02-20 DIAGNOSIS — R262 Difficulty in walking, not elsewhere classified: Secondary | ICD-10-CM | POA: Diagnosis not present

## 2016-02-20 NOTE — Therapy (Signed)
[facility] [place] [address] [place], [place], [address] Phone: [phone]   Fax:  [phone]  Physical Therapy Treatment  Patient Details  Name: [facility] MRN: [id number] Date of Birth: [birth year] Referring Provider: Rosezetta SchlatterPhillip Horne  Encounter Date: 02/20/2016      PT End of Session - 02/20/16 1528    Visit Number 3   Date for PT Re-Evaluation 04/13/16   PT Start Time 1442   PT Stop Time 1530   PT Time Calculation (min) 48 min   Activity Tolerance Patient tolerated treatment well   Behavior [facility] Therapy [facility] for tasks assessed/performed      Past Medical History  Diagnosis Date  . Asthma   . Hypercholesteremia   . Developmental delay     Past Surgical History  Procedure Laterality Date  . No surgical history      There were no vitals filed for this visit.      Subjective Assessment - 02/20/16 1447    Subjective Mom reports he was a little sore after the last time.   Currently in Pain? No/denies                         [facility] Adult PT Treatment/Exercise - 02/20/16 0001    Ambulation/Gait   Gait Comments outside around the building 1 lap no rest   High Level Balance   High Level Balance Comments all kicks switching feet, toe walking, heel walking   Lumbar Exercises: Aerobic   Stationary Bike Nustep Level 4x 5 minutes   UBE (Upper Arm Bike) Level 4 x 4 minutes4   Lumbar Exercises: Machines for Strengthening   Cybex Knee Extension 5# 2x10   Cybex Knee Flexion 20# 2x10   Other Lumbar Machine Exercise seated row 15#, lats 15# 2x10 each   Knee/Hip Exercises: Machines for Strengthening   Cybex Leg Press 40# 2x10   Knee/Hip Exercises: Standing   Other Standing Knee Exercises weighted ball over head lift 2x10, seated obliques with weighted ball 2x10, red tband ankle EF and Eversion                  PT Short Term Goals - 02/12/16 [id number]    PT SHORT TERM GOAL #1   Title  independent with initial HEP   Time 2   Status New           PT Long Term Goals - 02/12/16 [id number]    PT LONG TERM GOAL #1   Title increase right DF strength to 4/5   Time 8   Period Weeks   Status New   PT LONG TERM GOAL #2   Title walk 1 mile   Time 8   Period Weeks   Status New   PT LONG TERM GOAL #3   Title increase cervical ROM 25%   Time 8   Status New   PT LONG TERM GOAL #4   Title decrease trendelenberg gait pattern   Time 8   Period Weeks   Status New   PT LONG TERM GOAL #5   Title increase strength of the right quad to 4/5hip to 4/5   Time 8   Period Weeks   Status New               Plan - 02/20/16 1528    Clinical Impression Statement patient had increased drop foot and supination as he fatigued, catching his tow twice while  walking   PT Next Visit Plan slowly add exercises and balance   Consulted and Agree with Plan of Care Patient      Patient will benefit from skilled therapeutic intervention in order to improve the following deficits and impairments:  Abnormal gait, Difficulty walking, Decreased range of motion, Decreased strength, Impaired flexibility, Pain  Visit Diagnosis: Difficulty in walking, not elsewhere classified  Muscle weakness (generalized)     Problem List Patient Active Problem List   Diagnosis Date Noted  . 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 Right leg weakness 09/04/2015  . Clonus 09/04/2015  . Down syndrome 09/04/2015  . Developmental disability 09/04/2015    Jearld Lesch., PT 02/20/2016, 3:30 PM  [facility] [place] [address] [place], [place], [address] Phone: [phone]   Fax:  [phone]  Name: Kyrus Hyde MRN: [id number] Date of Birth: 1980-10-01

## 2016-02-22 ENCOUNTER — Ambulatory Visit: Payer: 59 | Admitting: Physical Therapy

## 2016-02-27 ENCOUNTER — Encounter: Payer: Self-pay | Admitting: Physical Therapy

## 2016-02-27 ENCOUNTER — Ambulatory Visit: Payer: 59 | Admitting: Physical Therapy

## 2016-02-27 DIAGNOSIS — R262 Difficulty in walking, not elsewhere classified: Secondary | ICD-10-CM

## 2016-02-27 DIAGNOSIS — M6281 Muscle weakness (generalized): Secondary | ICD-10-CM

## 2016-02-27 NOTE — Therapy (Signed)
[facility] [place] [address] [place], [place], [address] Phone: [phone]   Fax:  [phone]  Physical Therapy Treatment  Patient Details  Name: [facility] MRN: [id number] Date of Birth: 08-May-1981 Referring Provider: Rosezetta SchlatterPhillip Horne  Encounter Date: 02/27/2016      PT End of Session - 02/27/16 1535    Visit Number 4   Date for PT Re-Evaluation 04/13/16   PT Start Time 1455   PT Stop Time 1537   PT Time Calculation (min) 42 min   Activity Tolerance Patient tolerated treatment well   Behavior During Therapy [facility] for tasks assessed/performed      Past Medical History  Diagnosis Date  . Asthma   . Hypercholesteremia   . Developmental delay     Past Surgical History  Procedure Laterality Date  . No surgical history      There were no vitals filed for this visit.      Subjective Assessment - 02/27/16 1455    Subjective Reports he was sore after the last visit   Currently in Pain? No/denies                         [facility] Adult [facility] Treatment/Exercise - 02/27/16 0001    High Level Balance   High Level Balance Comments all kicks switching feet, toe walking, heel walking, standing airex ball tosses, resisted gait all directions   Lumbar Exercises: Aerobic   Stationary Bike Nustep Level 4x 5 minutes   Lumbar Exercises: Machines for Strengthening   Cybex Knee Extension 10# 2x10   Cybex Knee Flexion 25# 2x10   Other Lumbar Machine Exercise seated row 25#, lats 20# 2x10 each, 5# chest press 2x10   Knee/Hip Exercises: Machines for Strengthening   Cybex Leg Press 40# 2x10   Knee/Hip Exercises: Standing   Other Standing Knee Exercises weighted ball over head lift 2x10, seated obliques with weighted ball 2x10, red tband ankle EF and Eversion                  PT Short Term Goals - 02/27/16 1537    PT SHORT TERM GOAL #1   Title independent with initial HEP   Status Achieved            PT Long Term Goals - 02/12/16 [id number]    PT LONG TERM GOAL #1   Title increase right DF strength to 4/5   Time 8   Period Weeks   Status New   PT LONG TERM GOAL #2   Title walk 1 mile   Time 8   Period Weeks   Status New   PT LONG TERM GOAL #3   Title increase cervical ROM 25%   Time 8   Status New   PT LONG TERM GOAL #4   Title decrease trendelenberg gait pattern   Time 8   Period Weeks   Status New   PT LONG TERM GOAL #5   Title increase strength of the right quad to 4/5hip to 4/5   Time 8   Period Weeks   Status New               Plan - 02/27/16 1536    Clinical Impression Statement Continues to have increased difficulty with the right LE when he fatigues, increased to drag.  He has no issues with use of the arm, no c/o pain   PT Next Visit Plan slowly add  exercises and balance   Consulted and Agree with Plan of Care Patient      Patient will benefit from skilled therapeutic intervention in order to improve the following deficits and impairments:  Abnormal gait, Difficulty walking, Decreased range of motion, Decreased strength, Impaired flexibility, Pain  Visit Diagnosis: Difficulty in walking, not elsewhere classified  Muscle weakness (generalized)     Problem List Patient Active Problem List   Diagnosis Date Noted  . 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 Right leg weakness 09/04/2015  . Clonus 09/04/2015  . Down syndrome 09/04/2015  . Developmental disability 09/04/2015    Jearld Lesch., PT 02/27/2016, 3:38 PM  [facility] [place] [address] [place], [place], [address] Phone: [phone]   Fax:  [phone]  Name: Jerel Sardina MRN: [id number] Date of Birth: 1981/05/26

## 2016-02-28 ENCOUNTER — Encounter: Payer: Self-pay | Admitting: Physical Therapy

## 2016-02-28 ENCOUNTER — Ambulatory Visit: Payer: 59 | Admitting: Physical Therapy

## 2016-02-28 DIAGNOSIS — R262 Difficulty in walking, not elsewhere classified: Secondary | ICD-10-CM | POA: Diagnosis not present

## 2016-02-28 DIAGNOSIS — M6281 Muscle weakness (generalized): Secondary | ICD-10-CM

## 2016-02-28 NOTE — Therapy (Signed)
[facility] [place] [address] [place], [place], [address] Phone: [phone]   Fax:  [phone]  Physical Therapy Treatment  Patient Details  Name: [facility] MRN: [id number] Date of Birth: 1981/02/27 Referring Provider: Rosezetta Schlatter  Encounter Date: 02/28/2016      PT End of Session - 02/28/16 1611    Visit Number 5   Date for PT Re-Evaluation 04/13/16   PT Start Time 1533   PT Stop Time 1613   PT Time Calculation (min) 40 min   Activity Tolerance Patient tolerated treatment well   Behavior During Therapy [facility] for tasks assessed/performed      Past Medical History  Diagnosis Date  . Asthma   . Hypercholesteremia   . Developmental delay     Past Surgical History  Procedure Laterality Date  . No surgical history      There were no vitals filed for this visit.      Subjective Assessment - 02/28/16 1535    Subjective No issues per patient and his mother   Currently in Pain? No/denies                         [facility] Adult PT Treatment/Exercise - 02/28/16 0001    High Level Balance   High Level Balance Comments all kicks switching feet, toe walking, heel walking, standing airex ball tosses, resisted gait all directions   Lumbar Exercises: Aerobic   Stationary Bike Nustep Level 4x 5 minutes   UBE (Upper Arm Bike) Level 6 x 4 minutes   Lumbar Exercises: Machines for Strengthening   Cybex Knee Extension 10# 3x10   Cybex Knee Flexion 25# 3x10   Other Lumbar Machine Exercise seated row 25#, lats 20# 2x10 each, 10# chest press 2x10, red tband ankle eversion exercise   Knee/Hip Exercises: Machines for Strengthening   Cybex Leg Press 40# 2x15   Knee/Hip Exercises: Standing   Other Standing Knee Exercises weighted ball over head lift 2x10, seated obliques with weighted ball 2x10, red tband ankle EF and Eversion                  PT Short Term Goals - 02/27/16 1537    PT SHORT TERM  GOAL #1   Title independent with initial HEP   Status Achieved           PT Long Term Goals - 02/28/16 1612    PT LONG TERM GOAL #1   Title increase right DF strength to 4/5   Status On-going   PT LONG TERM GOAL #2   Title walk 1 mile   Status On-going               Plan - 02/28/16 1611    Clinical Impression Statement the right foot supinates and DF with gait causing the foot to catch especially when he gets tired.  With Tband he is able to d yellow ankle eversion but this is tough and weakn for him   PT Next Visit Plan slowly add exercises and balance   Consulted and Agree with Plan of Care Patient      Patient will benefit from skilled therapeutic intervention in order to improve the following deficits and impairments:  Abnormal gait, Difficulty walking, Decreased range of motion, Decreased strength, Impaired flexibility, Pain  Visit Diagnosis: Difficulty in walking, not elsewhere classified  Muscle weakness (generalized)     Problem List Patient Active Problem List  Diagnosis Date Noted  . 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 Right leg weakness 09/04/2015  . Clonus 09/04/2015  . Down syndrome 09/04/2015  . Developmental disability 09/04/2015    Jearld LeschALBRIGHT,Chella Chapdelaine W., PT 02/28/2016, 4:14 PM  [facility] [place] [address] [place], [place], [address] Phone: [phone]   Fax:  [phone]  Name: Aaron [facility] MRN: [id number] Date of Birth: August 23, 1981

## 2016-03-04 ENCOUNTER — Encounter: Payer: Self-pay | Admitting: Physical Therapy

## 2016-03-04 ENCOUNTER — Ambulatory Visit: Payer: 59 | Admitting: Physical Therapy

## 2016-03-04 DIAGNOSIS — R262 Difficulty in walking, not elsewhere classified: Secondary | ICD-10-CM

## 2016-03-04 DIAGNOSIS — M6281 Muscle weakness (generalized): Secondary | ICD-10-CM

## 2016-03-04 NOTE — Therapy (Signed)
[facility] [address] [place], [place], [address] Phone: [phone]   Fax:  [phone]  Physical Therapy Treatment  Patient Details  Name: Aaron Campbell MRN: [id number] Date of Birth: 07/01/1981 Referring Provider: Rosezetta Schlatter  Encounter Date: 03/04/2016      PT End of Session - 03/04/16 1559    Visit Number 6   Date for PT Re-Evaluation 04/13/16   PT Start Time 1526   PT Stop Time 1600   PT Time Calculation (min) 34 min      Past Medical History  Diagnosis Date  . Asthma   . Hypercholesteremia   . Developmental delay     Past Surgical History  Procedure Laterality Date  . No surgical history      There were no vitals filed for this visit.      Subjective Assessment - 03/04/16 1528    Subjective "Good"   Currently in Pain? No/denies   Pain Score 0-No pain                         [facility] Adult PT Treatment/Exercise - 03/04/16 0001    Lumbar Exercises: Aerobic   Stationary Bike Nustep Level 4x 5 minutes   UBE (Upper Arm Bike) Level 6 x 4 minutes   Lumbar Exercises: Machines for Strengthening   Cybex Knee Extension 10# 3x10   Cybex Knee Flexion 25# 3x10   Other Lumbar Machine Exercise seated row 25#, lats 25# 2x10 each, 10# chest press 2x10,   Knee/Hip Exercises: Machines for Strengthening   Cybex Leg Press 50# 3x10   Knee/Hip Exercises: Standing   Other Standing Knee Exercises weighted ball over head lift 2x10, seated obliques with weighted ball 2x10, red tband ankle DF and Eversion                  PT Short Term Goals - 02/27/16 1537    PT SHORT TERM GOAL #1   Title independent with initial HEP   Status Achieved           PT Long Term Goals - 02/28/16 1612    PT LONG TERM GOAL #1   Title increase right DF strength to 4/5   Status On-going   PT LONG TERM GOAL #2   Title walk 1 mile   Status On-going               Plan - 03/04/16 1600    Clinical Impression Statement Pt ~ 11 minutes late for today's treatment. Decrease motor control with ankle exercises. Completed all other exercises well. Ques needed to for pt to use full available ROM on leg press machine.   Rehab Potential Good   PT Frequency 3x / week   PT Duration 8 weeks   PT Treatment/Interventions Moist Heat;Electrical Stimulation;Gait training;Therapeutic activities;Therapeutic exercise;Manual techniques;Patient/family education;Passive range of motion;ADLs/Self Care Home Management   PT Next Visit Plan slowly add exercises and balance      Patient will benefit from skilled therapeutic intervention in order to improve the following deficits and impairments:  Abnormal gait, Difficulty walking, Decreased range of motion, Decreased strength, Impaired flexibility, Pain  Visit Diagnosis: Difficulty in walking, not elsewhere classified  Muscle weakness (generalized)     Problem List Patient Active Problem List   Diagnosis Date Noted  . 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 Right leg weakness 09/04/2015  . Clonus 09/04/2015  . Down syndrome 09/04/2015  .  Developmental disability 09/04/2015    Grayce Sessionsonald G Miles Leyda, PTA  03/04/2016, 4:05 PM  [facility] [address] [place], [place], [address] Phone: [phone]   Fax:  [phone]  Name: Aaron Campbell MRN: [id number] Date of Birth: [birth year]

## 2016-03-05 IMAGING — CT CT CERVICAL SPINE W/O CM
3 of 4 series · 13 of 33 positions shown, 16 images · non-contrast
Comparison: Cervical spine MRI 09/24/2015

CLINICAL DATA: Cervical myelopathy.

EXAM:
CT CERVICAL SPINE WITHOUT CONTRAST
TECHNIQUE: Multidetector CT imaging of the cervical spine was performed without
intravenous contrast. Multiplanar CT image reconstructions were also
generated.

[Series 6: cor · coronal · 0.24mm/px · 3 of 64 slices shown]
[im 13/64  bone]
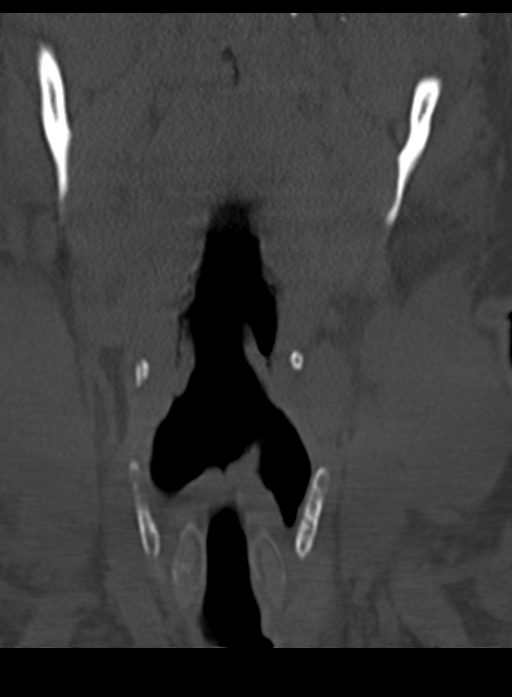
[im 26/64  bone]
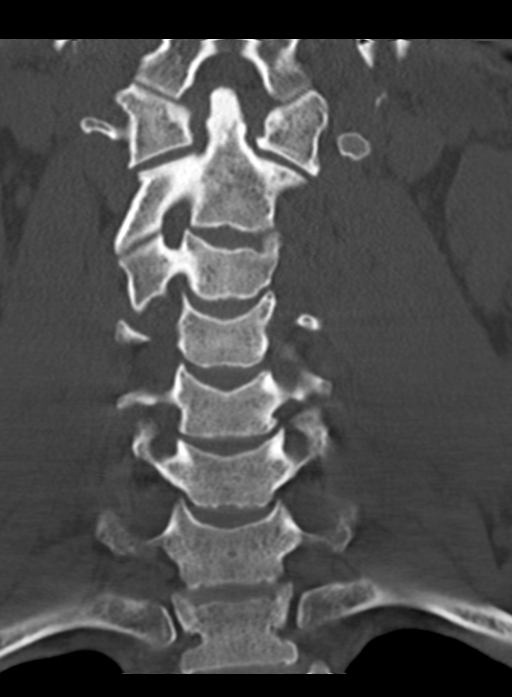
[im 38/64  bone]
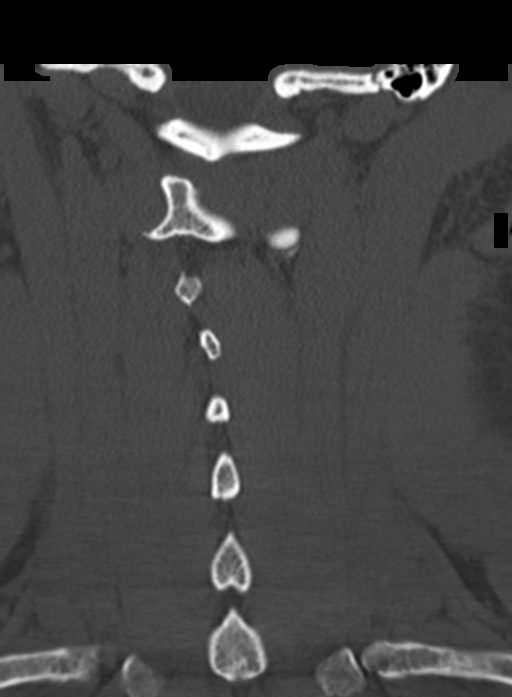

[Series 7: sag · sagittal · 0.23mm/px · 5 of 62 slices shown, 6 images]
[im 21/62  bone]
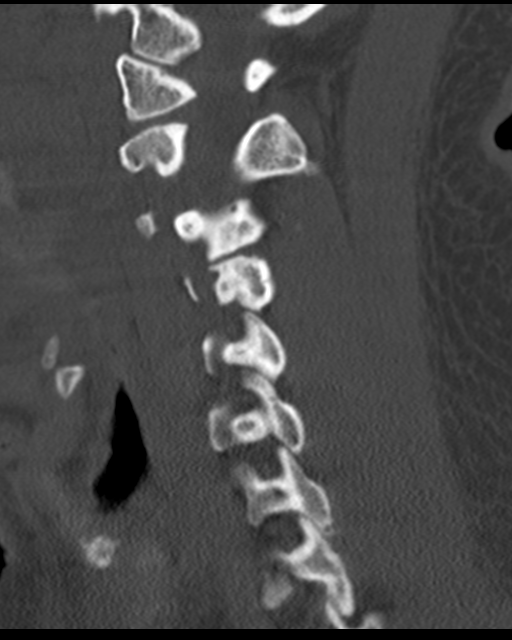
[im 26/62  bone]
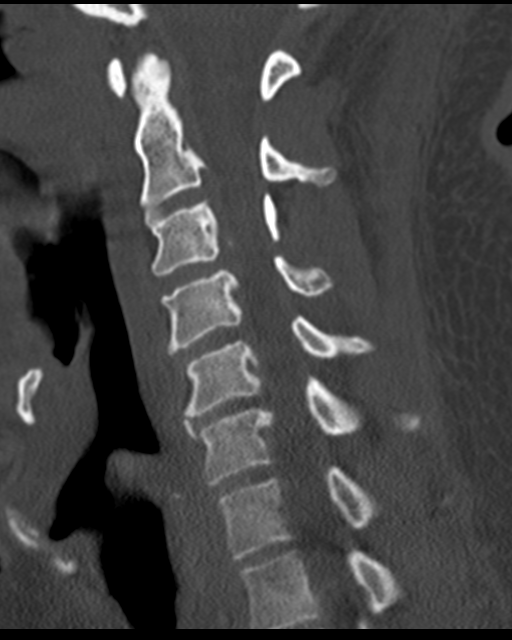
[im 31/62  soft-tissue]
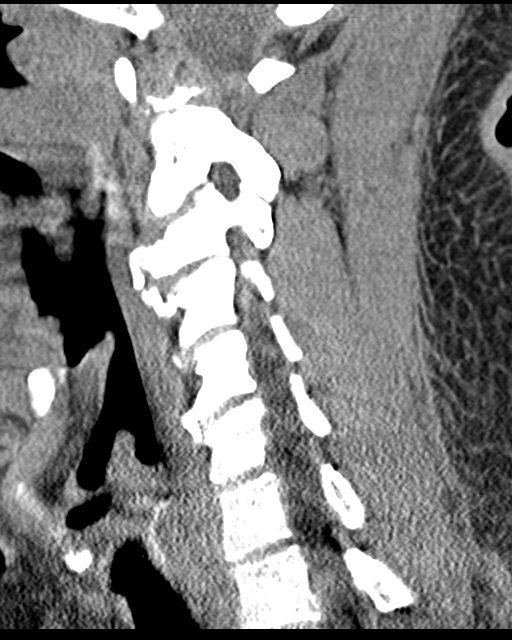
[im 31/62  bone]
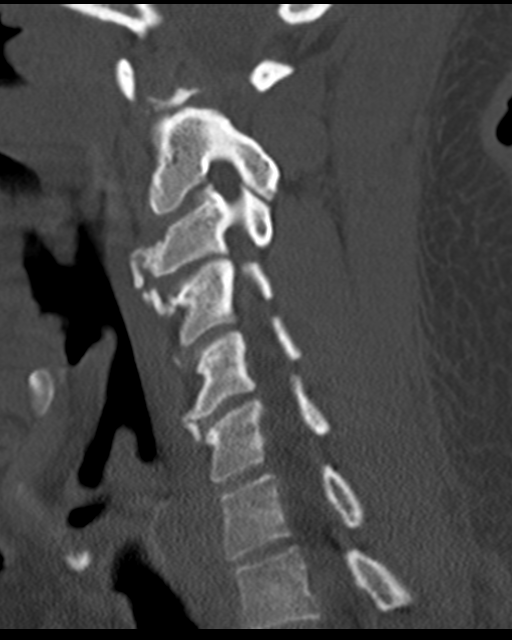
[im 36/62  bone]
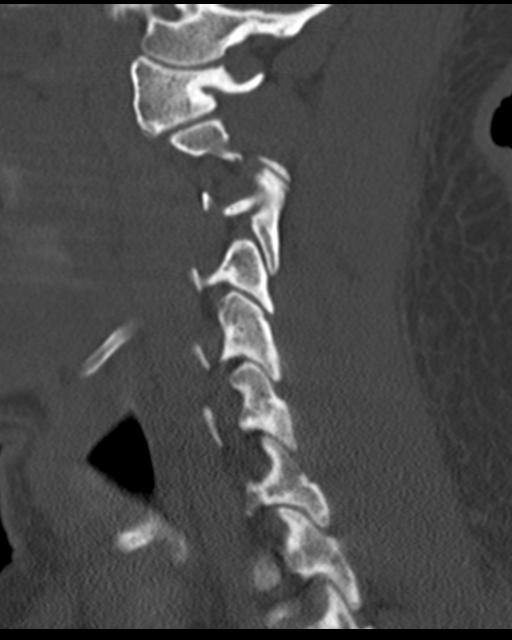
[im 41/62  bone]
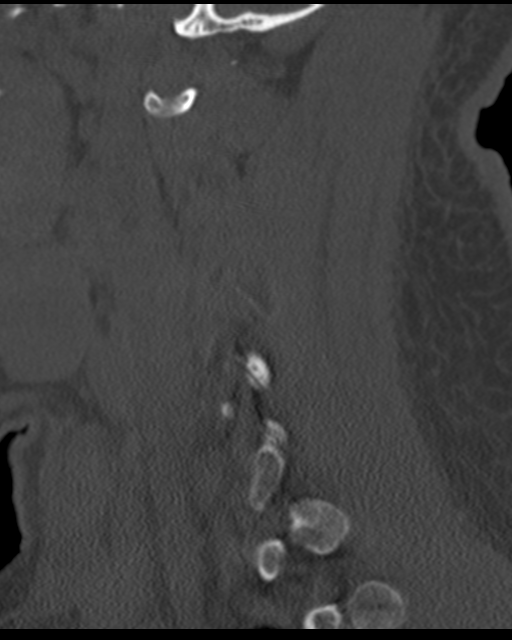

[Series 8: angled axial · axial · 0.24mm/px · z∈[+72,+175]mm · 5 of 82 slices shown, 7 images]
[im 14/82  soft-tissue]
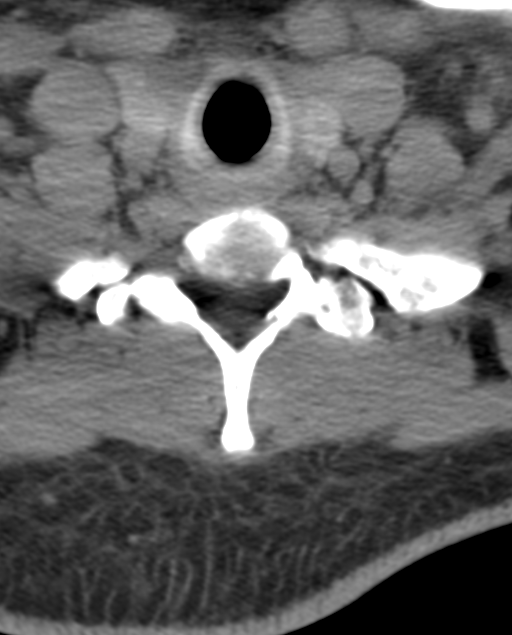
[im 14/82  bone]
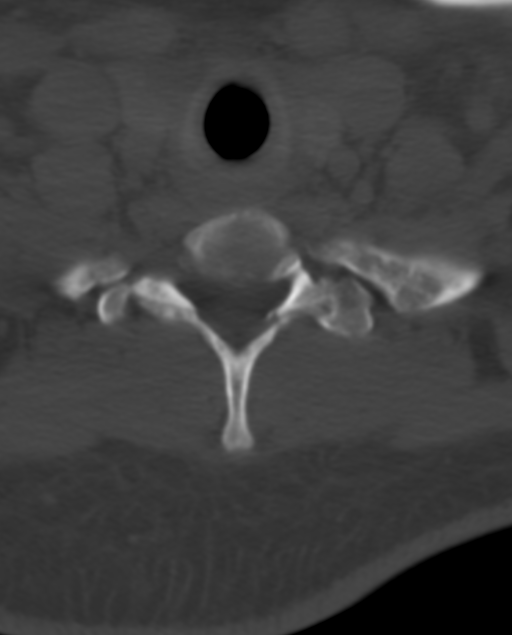
[im 28/82  bone]
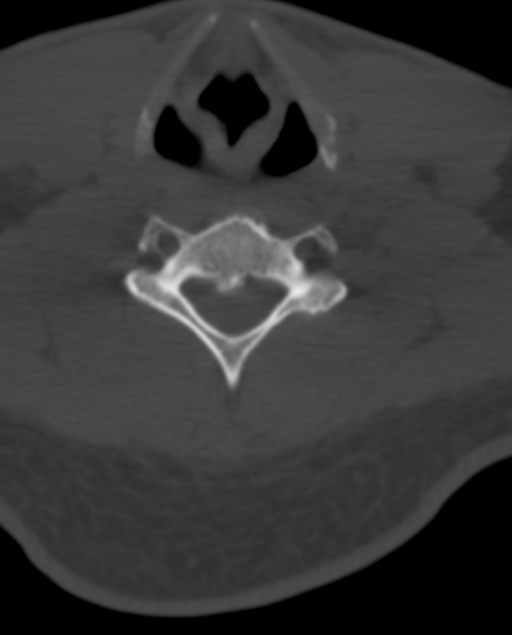
[im 41/82  bone]
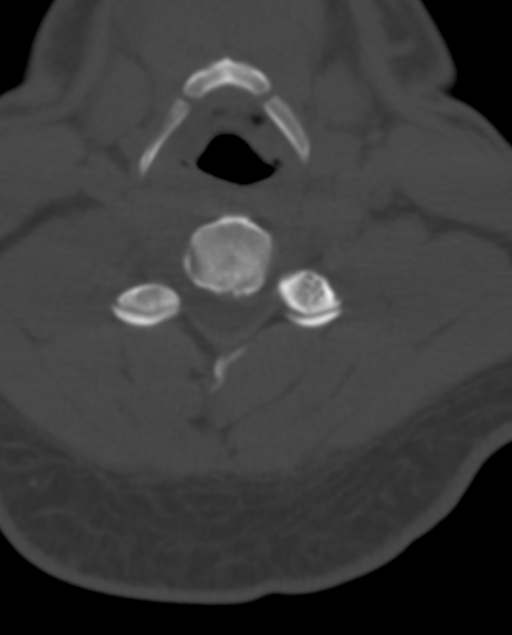
[im 55/82  bone]
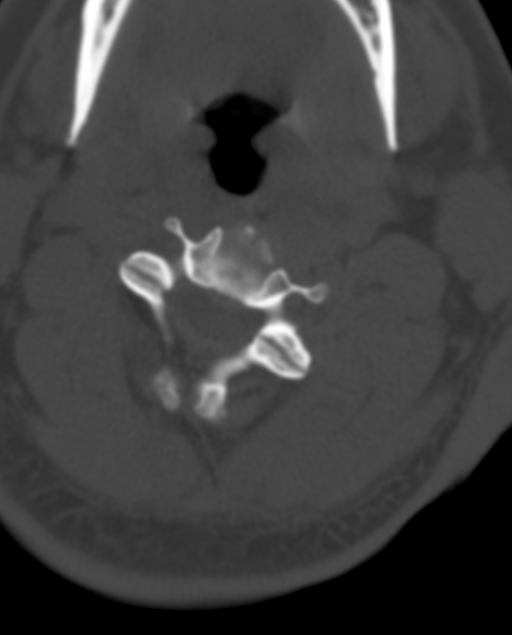
[im 68/82  soft-tissue]
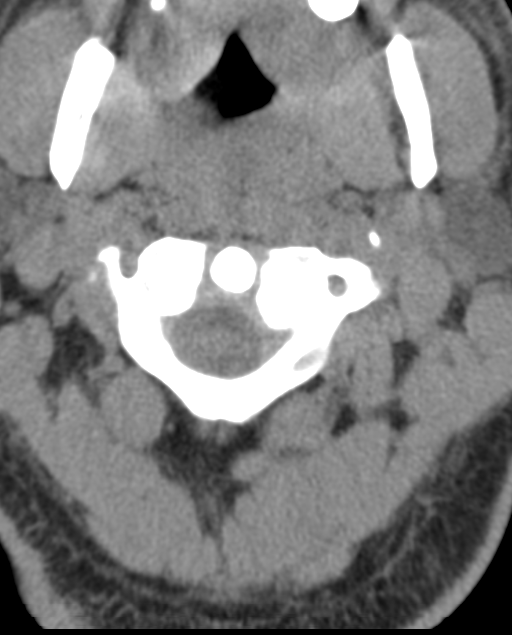
[im 68/82  bone]
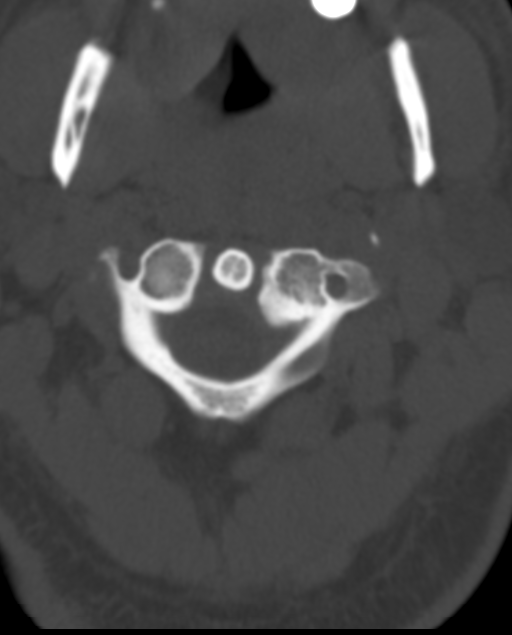

[13 of 33 positions shown; findings below may reference images not displayed]

FINDINGS: Abnormal appearance of the right C3-4 facet which appears jumped
with rotated C3 vertebra. Congenital absence of pedicle can give
this appearance, but no vertebral ring deficiency is seen. This may
still be congenital as there is compensatory tall appearance of the
C3 right articular process, but presence of myelomalacia on recent
MRI favors previous traumatic event. The malaligned right C3-4 facet
is degenerated with sclerosis and subchondral cystic change.

The spinal canal is congenitally small. There is superimposed C3-4
and C5-6 disc herniations as reported on recent cervical spine MRI.
Especially at C5-6, herniation is associated with annulus
ossification and endplate ridging.
IMPRESSION: 1. Remote jumped C3-4 right facet versus dysmorphism. This is at the
same level as myelomalacia on recent MRI.
2. Congenitally narrow spinal canal with superimposed C3-4 and C5-6
disc herniation, recently characterized by MRI.

## 2016-03-06 ENCOUNTER — Ambulatory Visit: Payer: 59 | Admitting: Physical Therapy

## 2016-03-12 ENCOUNTER — Ambulatory Visit: Payer: 59 | Admitting: Physical Therapy

## 2016-03-12 ENCOUNTER — Encounter: Payer: Self-pay | Admitting: Physical Therapy

## 2016-03-12 DIAGNOSIS — M6281 Muscle weakness (generalized): Secondary | ICD-10-CM

## 2016-03-12 DIAGNOSIS — R262 Difficulty in walking, not elsewhere classified: Secondary | ICD-10-CM | POA: Diagnosis not present

## 2016-03-12 DIAGNOSIS — R29898 Other symptoms and signs involving the musculoskeletal system: Secondary | ICD-10-CM

## 2016-03-12 NOTE — Therapy (Signed)
[facility] [address] [place], [place], [address] Phone: [phone]   Fax:  [phone]  Physical Therapy Treatment  Patient Details  Name: Aaron Campbell MRN: [id number] Date of Birth: [birth year] Referring Provider: Lafe Garin  Encounter Date: 03/12/2016      PT End of Session - 03/12/16 1149    Visit Number 7   Date for PT Re-Evaluation 04/13/16   PT Start Time 1102   PT Stop Time 1149   PT Time Calculation (min) 47 min   Activity Tolerance Patient tolerated treatment well   Behavior During Therapy [facility] for tasks assessed/performed      Past Medical History  Diagnosis Date  . Asthma   . Hypercholesteremia   . Developmental delay     Past Surgical History  Procedure Laterality Date  . No surgical history      There were no vitals filed for this visit.      Subjective Assessment - 03/12/16 1108    Subjective Pt mom reports that pt fell yesterday on the stairs. Pt with a laceration on forehead   Currently in Pain? No/denies   Pain Score 0-No pain            [facility] PT Assessment - 03/12/16 0001    AROM   Overall AROM Comments cervcial ROM is decreaesd 25%, shoulder ROM is WFL's   Strength   Overall Strength Comments UE strength is 4/5 , LE's 5/5, right hip abduction is 5/5 right ankle DF is -4/5                     [facility] Adult PT Treatment/Exercise - 03/12/16 0001    High Level Balance   High Level Balance Comments all kicks switching feet, toe walking, heel walking, standing airex ball tosses, resisted gait all directions   Lumbar Exercises: Aerobic   Stationary Bike Nustep Level 4x 6 minutes   UBE (Upper Arm Bike) Level 6 x 4 minutes   Lumbar Exercises: Machines for Strengthening   Other Lumbar Machine Exercise seated row 25#, lats 25# 2x10 each, 10# chest press 2x10,                  PT Short Term Goals - 02/27/16 1537    PT SHORT TERM GOAL #1   Title  independent with initial HEP   Status Achieved           PT Long Term Goals - 03/12/16 1148    PT LONG TERM GOAL #1   Title increase right DF strength to 4/5   Status On-going   PT LONG TERM GOAL #2   Title walk 1 mile   Status Achieved   PT LONG TERM GOAL #3   Title increase cervical ROM 25%   Status Achieved   PT LONG TERM GOAL #4   Title decrease trendelenberg gait pattern   Status On-going   PT LONG TERM GOAL #5   Title increase strength of the right quad to 4/5hip to 4/5   Status Achieved               Plan - 03/12/16 1152    Clinical Impression Statement Pt has progressed and met some goals. Pt with decrease motor control and stability with his lateral ability when kicking ball. During resisted side step decrease foot clarence with trailing LE, unable to correct with cues.   Rehab Potential Good   PT Frequency 3x / week  PT Duration 8 weeks   PT Treatment/Interventions Moist Heat;Electrical Stimulation;Gait training;Therapeutic activities;Therapeutic exercise;Manual techniques;Patient/family education;Passive range of motion;ADLs/Self Care Home Management   PT Next Visit Plan slowly add exercises and balance      Patient will benefit from skilled therapeutic intervention in order to improve the following deficits and impairments:  Abnormal gait, Difficulty walking, Decreased range of motion, Decreased strength, Impaired flexibility, Pain  Visit Diagnosis: Difficulty in walking, not elsewhere classified  Right leg weakness  Muscle weakness (generalized)  Difficulty walking     Problem List Patient Active Problem List   Diagnosis Date Noted  . Upper motor neuron lesion ([place]) 09/04/2015  . Right leg weakness 09/04/2015  . Clonus 09/04/2015  . Down syndrome 09/04/2015  . Developmental disability 09/04/2015    Scot Jun, PTA 03/12/2016, 11:55 AM  [facility]  [place], [place], [address] Phone: [phone]   Fax:  [phone]  Name: Jatniel Verastegui MRN: [id number] Date of Birth: 06-23-1981

## 2016-03-25 ENCOUNTER — Ambulatory Visit: Payer: 59 | Attending: Orthopaedic Surgery | Admitting: Physical Therapy

## 2016-03-25 ENCOUNTER — Encounter: Payer: Self-pay | Admitting: Physical Therapy

## 2016-03-25 DIAGNOSIS — R262 Difficulty in walking, not elsewhere classified: Secondary | ICD-10-CM | POA: Insufficient documentation

## 2016-03-25 DIAGNOSIS — M6281 Muscle weakness (generalized): Secondary | ICD-10-CM | POA: Diagnosis present

## 2016-03-25 DIAGNOSIS — R29898 Other symptoms and signs involving the musculoskeletal system: Secondary | ICD-10-CM | POA: Insufficient documentation

## 2016-03-25 NOTE — Therapy (Signed)
[facility] [address] [place], [place], [address] Phone: [phone]   Fax:  [phone]  Physical Therapy Treatment  Patient Details  Name: Aaron Campbell MRN: [id number] Date of Birth: [birth year] Referring Provider: Rosezetta SchlatterPhillip Horne  Encounter Date: 03/25/2016      PT End of Session - 03/25/16 1159    Visit Number 8   Date for PT Re-Evaluation 04/13/16   PT Start Time 1100   PT Stop Time 1150   PT Time Calculation (min) 50 min   Activity Tolerance Patient tolerated treatment well   Behavior During Therapy [facility] for tasks assessed/performed      Past Medical History  Diagnosis Date  . Asthma   . Hypercholesteremia   . Developmental delay     Past Surgical History  Procedure Laterality Date  . No surgical history      There were no vitals filed for this visit.      Subjective Assessment - 03/25/16 1114    Subjective Patient reports no issues after the fall.   Currently in Pain? No/denies                         [facility] Adult PT Treatment/Exercise - 03/25/16 0001    Ambulation/Gait   Gait Comments outside around the building 1 lap no rest and up and down stairs, tried some walking with an off the shelf AFO, but this actually made his gait worse   High Level Balance   High Level Balance Comments all kicks switching feet, toe walking, heel walking, standing airex ball tosses, resisted gait all directions   Lumbar Exercises: Aerobic   Stationary Bike Nustep Level 6x 6 minutes   UBE (Upper Arm Bike) Level 6 x 4 minutes   Lumbar Exercises: Machines for Strengthening   Cybex Knee Extension 5# single legs only   Other Lumbar Machine Exercise seated row 25#, lats 25# 2x10 each, 10# chest press 2x10,   Knee/Hip Exercises: Machines for Strengthening   Cybex Leg Press 20# single leg only, very weak on the right needing assist to complete   Knee/Hip Exercises: Standing   Other Standing Knee  Exercises red tband ankle exercises for right only, very weak                  PT Short Term Goals - 02/27/16 1537    PT SHORT TERM GOAL #1   Title independent with initial HEP   Status Achieved           PT Long Term Goals - 03/12/16 1148    PT LONG TERM GOAL #1   Title increase right DF strength to 4/5   Status On-going   PT LONG TERM GOAL #2   Title walk 1 mile   Status Achieved   PT LONG TERM GOAL #3   Title increase cervical ROM 25%   Status Achieved   PT LONG TERM GOAL #4   Title decrease trendelenberg gait pattern   Status On-going   PT LONG TERM GOAL #5   Title increase strength of the right quad to 4/5hip to 4/5   Status Achieved               Plan - 03/25/16 1200    Clinical Impression Statement the right LE is very weak and demonstrates ataxic motions, especially with trying to kick a ball, he almost fell on two occasions as his toe caught the floor  and tripped him, he was able to recover on his own.  He was markedly weaker on the right with single leg activities   PT Next Visit Plan focus on some single leg activities to work on right LE strength   Consulted and Agree with Plan of Care Patient      Patient will benefit from skilled therapeutic intervention in order to improve the following deficits and impairments:  Abnormal gait, Difficulty walking, Decreased range of motion, Decreased strength, Impaired flexibility, Pain  Visit Diagnosis: Difficulty in walking, not elsewhere classified  Right leg weakness  Muscle weakness (generalized)     Problem List Patient Active Problem List   Diagnosis Date Noted  . 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 Right leg weakness 09/04/2015  . Clonus 09/04/2015  . Down syndrome 09/04/2015  . Developmental disability 09/04/2015    Jearld Lesch., PT 03/25/2016, 12:03 PM  [facility] [place] [facility] [address] [place], [place],  [address] Phone: [phone]   Fax:  [phone]  Name: Aaron Campbell MRN: [id number] Date of Birth: [birth year]

## 2016-03-28 ENCOUNTER — Ambulatory Visit: Payer: 59 | Admitting: Physical Therapy

## 2016-04-23 ENCOUNTER — Encounter: Payer: Self-pay | Admitting: Physical Therapy

## 2016-04-23 ENCOUNTER — Ambulatory Visit: Payer: Commercial Indemnity | Attending: Orthopaedic Surgery | Admitting: Physical Therapy

## 2016-04-23 DIAGNOSIS — R29898 Other symptoms and signs involving the musculoskeletal system: Secondary | ICD-10-CM | POA: Diagnosis present

## 2016-04-23 DIAGNOSIS — M6281 Muscle weakness (generalized): Secondary | ICD-10-CM | POA: Insufficient documentation

## 2016-04-23 DIAGNOSIS — R262 Difficulty in walking, not elsewhere classified: Secondary | ICD-10-CM | POA: Diagnosis present

## 2016-04-23 NOTE — Therapy (Signed)
[facility] [address] [place], [place], [address] Phone: [phone]   Fax:  [phone]  Physical Therapy Treatment  Patient Details  Name: Aaron Campbell MRN: [id number] Date of Birth: [birth year] Referring Provider: Rosezetta Schlatter  Encounter Date: 04/23/2016      PT End of Session - 04/23/16 1520    Visit Number 9   Date for PT Re-Evaluation 05/24/16   PT Start Time 1439   PT Stop Time 1530   PT Time Calculation (min) 51 min   Activity Tolerance Patient tolerated treatment well   Behavior During Therapy [facility] for tasks assessed/performed      Past Medical History:  Diagnosis Date  . Asthma   . Developmental delay   . Hypercholesteremia     Past Surgical History:  Procedure Laterality Date  . no surgical history      There were no vitals filed for this visit.      Subjective Assessment - 04/23/16 1450    Subjective No falls, no pain   Currently in Pain? No/denies                         [facility] Adult PT Treatment/Exercise - 04/23/16 0001      Ambulation/Gait   Gait Comments gait down stairs, up stairs trying step over step, he tends to revert to single step when going down. around building with fatigue he started to really drag the foot today     High Level Balance   High Level Balance Comments all kicks switching feet, toe walking, heel walking, standing airex ball tosses, resisted gait all directions     Lumbar Exercises: Aerobic   Stationary Bike Nustep Level 6x 6 minutes     Lumbar Exercises: Machines for Strengthening   Other Lumbar Machine Exercise seated row 25#, lats 25# 2x10 each, 10# chest press 2x10,, leg press 40# 2x15     Knee/Hip Exercises: Standing   Other Standing Knee Exercises red tband ankle exercises for right only, very weak                  PT Short Term Goals - 02/27/16 1537      PT SHORT TERM GOAL #1   Title independent with initial HEP   Status Achieved           PT Long Term Goals - 04/23/16 1527      PT LONG TERM GOAL #1   Title increase right DF strength to 4/5   Status On-going     PT LONG TERM GOAL #2   Title walk 1 mile   Status Achieved     PT LONG TERM GOAL #3   Title increase cervical ROM 25%   Status Achieved     PT LONG TERM GOAL #4   Title decrease trendelenberg gait pattern   Status On-going     PT LONG TERM GOAL #5   Title increase strength of the right quad to 4/5hip to 4/5   Status Achieved               Plan - 04/23/16 1520    Clinical Impression Statement Patient has been on vacation the past month, he returns today, denies any pain and reports no falls.  He continues to have the drop foot on the right much worse with fast motions and with fatigue, going up hills he caught the toe and lost balance, was able to  correct on own   PT Frequency 2x / week   PT Duration 4 weeks   PT Next Visit Plan work on the LE strength especially ankle and hips   Consulted and Agree with Plan of Care Patient      Patient will benefit from skilled therapeutic intervention in order to improve the following deficits and impairments:  Abnormal gait, Difficulty walking, Decreased range of motion, Decreased strength, Impaired flexibility, Pain  Visit Diagnosis: Difficulty in walking, not elsewhere classified - Plan: PT plan of care cert/re-cert  Right leg weakness - Plan: PT plan of care cert/re-cert  Muscle weakness (generalized) - Plan: PT plan of care cert/re-cert     Problem List Patient Active Problem List   Diagnosis Date Noted  . 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 Right leg weakness 09/04/2015  . Clonus 09/04/2015  . Down syndrome 09/04/2015  . Developmental disability 09/04/2015    Jearld LeschALBRIGHT,Halena Mohar W., PT 04/23/2016, 3:30 PM  [facility] [address] [place], [place], [address] Phone: [phone]   Fax:   [phone]  Name: Aaron Campbell MRN: [id number] Date of Birth: 1981-09-06

## 2016-04-28 ENCOUNTER — Ambulatory Visit: Payer: Commercial Indemnity | Admitting: Physical Therapy

## 2016-04-28 DIAGNOSIS — R29898 Other symptoms and signs involving the musculoskeletal system: Secondary | ICD-10-CM

## 2016-04-28 DIAGNOSIS — R262 Difficulty in walking, not elsewhere classified: Secondary | ICD-10-CM

## 2016-04-28 DIAGNOSIS — M6281 Muscle weakness (generalized): Secondary | ICD-10-CM

## 2016-04-28 NOTE — Therapy (Signed)
[facility] [address] [place], [place], [address] Phone: [phone]   Fax:  [phone]  Physical Therapy Treatment  Patient Details  Name: Aaron Campbell MRN: [id number] Date of Birth: 08-Sep-1981 Referring Provider: Rosezetta SchlatterPhillip Horne  Encounter Date: 04/28/2016      PT End of Session - 04/28/16 1348    Visit Number 10   Date for PT Re-Evaluation 05/24/16   PT Start Time 1303   PT Stop Time 1345   PT Time Calculation (min) 42 min   Activity Tolerance Patient tolerated treatment well   Behavior During Therapy [facility] for tasks assessed/performed      Past Medical History:  Diagnosis Date  . Asthma   . Developmental delay   . Hypercholesteremia     Past Surgical History:  Procedure Laterality Date  . no surgical history      There were no vitals filed for this visit.      Subjective Assessment - 04/28/16 1305    Subjective Reports no pain today, no falls.    Currently in Pain? No/denies                         [facility] Adult PT Treatment/Exercise - 04/28/16 0001      Ambulation/Gait   Gait Comments gait down stairs, up stairs trying step over step, he tends to revert to single step when going down. around building with fatigue he started to really drag the foot today     High Level Balance   High Level Balance Comments Minitramp marches and boucing x1 min ea.     Lumbar Exercises: Aerobic   Stationary Bike Nustep Level 6x 6 minutes     Knee/Hip Exercises: Machines for Strengthening   Cybex Leg Press 20# 2x10 BLE's, 20# heel raises 2x10     Knee/Hip Exercises: Standing   Heel Raises 2 sets;10 reps   Heel Raises Limitations On airex   Hip Flexion Stengthening;10 reps;Right   Hip Flexion Limitations pulley/5#   Forward Lunges 20 reps   Forward Lunges Limitations To BOSU. RLE only   Hip ADduction Strengthening;Right;10 reps   Hip ADduction Limitations pulley/5#   Hip Abduction  Stengthening;Right;10 reps   Abduction Limitations pulley 5#   Hip Extension Stengthening;Right;10 reps   Extension Limitations pulley 5#   Forward Step Up 20 reps   Forward Step Up Limitations to 6 in step R/L, RLE not full step   Wall Squat 10 reps   Wall Squat Limitations With red pball behind back   Other Standing Knee Exercises Toe raises on airex 2x10   Other Standing Knee Exercises green tband DF/PF x20 R only                  PT Short Term Goals - 02/27/16 1537      PT SHORT TERM GOAL #1   Title independent with initial HEP   Status Achieved           PT Long Term Goals - 04/23/16 1527      PT LONG TERM GOAL #1   Title increase right DF strength to 4/5   Status On-going     PT LONG TERM GOAL #2   Title walk 1 mile   Status Achieved     PT LONG TERM GOAL #3   Title increase cervical ROM 25%   Status Achieved     PT LONG TERM GOAL #4  Title decrease trendelenberg gait pattern   Status On-going     PT LONG TERM GOAL #5   Title increase strength of the right quad to 4/5hip to 4/5   Status Achieved               Plan - 04/28/16 1348    Clinical Impression Statement Pt had difficulty with hip extension with 5#, could not complete correctly, HS curls instead. Pt had decreased foot clearance during forward step ups. Pt had difficulty on stairs without UE support could not utilize step over step pattern, had to step to each step every time. R toe started to drag on steps with fatigue.    Rehab Potential Good   PT Frequency 2x / week   PT Duration 4 weeks   PT Treatment/Interventions Moist Heat;Electrical Stimulation;Gait training;Therapeutic activities;Therapeutic exercise;Manual techniques;Patient/family education;Passive range of motion;ADLs/Self Care Home Management   PT Next Visit Plan Continue with strengthening for RLE, in ankle and hips, implement more functional squats.       Patient will benefit from skilled therapeutic intervention  in order to improve the following deficits and impairments:  Abnormal gait, Difficulty walking, Decreased range of motion, Decreased strength, Impaired flexibility, Pain  Visit Diagnosis: Difficulty in walking, not elsewhere classified  Right leg weakness  Muscle weakness (generalized)     Problem List Patient Active Problem List   Diagnosis Date Noted  . 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 Right leg weakness 09/04/2015  . Clonus 09/04/2015  . Down syndrome 09/04/2015  . Developmental disability 09/04/2015    Justus MemoryBrianna Jouri Threat, SPTA 04/28/2016, 1:52 PM  [facility] [address] [place], [place], [address] Phone: [phone]   Fax:  [phone]  Name: Aaron Campbell MRN: [id number] Date of Birth: 1980/11/27

## 2016-05-01 ENCOUNTER — Ambulatory Visit: Payer: Commercial Indemnity | Admitting: Physical Therapy

## 2016-05-01 ENCOUNTER — Ambulatory Visit (INDEPENDENT_AMBULATORY_CARE_PROVIDER_SITE_OTHER): Payer: 59 | Admitting: Psychiatry

## 2016-05-01 ENCOUNTER — Encounter (INDEPENDENT_AMBULATORY_CARE_PROVIDER_SITE_OTHER): Payer: Self-pay

## 2016-05-01 DIAGNOSIS — Q909 Down syndrome, unspecified: Secondary | ICD-10-CM

## 2016-05-01 NOTE — Progress Notes (Signed)
Pt. Presented with his mother, Bonnita LevanBrenda Mazariego, for mental health assessment. Pt. Does not present with or report any symptoms indicating a mental health diagnosis. Pt. Was cooperative and responsive to questions. Pt.'s mother indicated some mild lethargy, but related to surgery on spine in March of 2017. Pt. Reports that his appetite and sleep are normal and that he anticipates returning to normal physical activity 6-12 months post surgery. Pt. Reports that he is active socially, enjoys cleaning around his home, fishing, and watching sports especially football.

## 2016-05-13 ENCOUNTER — Encounter: Payer: Self-pay | Admitting: Physical Therapy

## 2016-05-13 ENCOUNTER — Ambulatory Visit: Payer: Commercial Indemnity | Admitting: Physical Therapy

## 2016-05-13 DIAGNOSIS — R29898 Other symptoms and signs involving the musculoskeletal system: Secondary | ICD-10-CM

## 2016-05-13 DIAGNOSIS — R262 Difficulty in walking, not elsewhere classified: Secondary | ICD-10-CM | POA: Diagnosis not present

## 2016-05-13 DIAGNOSIS — M6281 Muscle weakness (generalized): Secondary | ICD-10-CM

## 2016-05-13 NOTE — Therapy (Signed)
[address] [place], [place], [address] Phone: [phone]   Fax:  [phone]  Physical Therapy Treatment  Patient Details  Name: Aaron Campbell MRN: [id number] Date of Birth: May 31, 1981 Referring Provider: Lafe Garin  Encounter Date: 05/13/2016      PT End of Session - 05/13/16 1112    Visit Number 11   Date for PT Re-Evaluation 05/24/16   PT Start Time 1110   PT Stop Time 1145   PT Time Calculation (min) 35 min      Past Medical History:  Diagnosis Date  . Asthma   . Developmental delay   . Hypercholesteremia     Past Surgical History:  Procedure Laterality Date  . no surgical history      There were no vitals filed for this visit.      Subjective Assessment - 05/13/16 1111    Subjective pt arrives without anyone with him,no pain or issues when questioned.   Currently in Pain? No/denies                         [facility] Adult PT Treatment/Exercise - 05/13/16 0001      High Level Balance   High Level Balance Activities --  walking on toes and heels     Lumbar Exercises: Aerobic   Stationary Bike L 5 6 min     Lumbar Exercises: Machines for Strengthening   Other Lumbar Machine Exercise rows and lats 25# 2 set s10     Knee/Hip Exercises: Machines for Strengthening   Cybex Knee Extension 10# 2 sets 10   Cybex Knee Flexion 20# 2 sets 10   Cybex Leg Press 40# 2 sets 10,heel raises 2 sets 10  max cuing and assistance with heel raises     Knee/Hip Exercises: Standing   Heel Raises Both;2 sets;10 reps  on airex   Heel Raises Limitations toe raises on airex 20 times   Other Standing Knee Exercises on airex hip 3 way 15 times red tband     Knee/Hip Exercises: Seated   Sit to Sand 20 reps;without UE support  t ball                  PT Short Term Goals - 02/27/16 1537      PT SHORT TERM GOAL #1   Title independent with initial HEP   Status Achieved           PT Long Term Goals - 05/13/16 1111      PT LONG TERM GOAL #1   Title increase right DF strength to 4/5   Status Not Met     PT LONG TERM GOAL #2   Title walk 1 mile   Status Achieved     PT LONG TERM GOAL #3   Title increase cervical ROM 25%   Status Achieved     PT LONG TERM GOAL #4   Title decrease trendelenberg gait pattern   Status On-going     PT LONG TERM GOAL #5   Title increase strength of the right quad to 4/5hip to 4/5   Status Achieved               Plan - 05/13/16 1113    Clinical Impression Statement all goals met except DF goal- DF is still weak and as pt fatigues increased foot drop with decreased balance. VCing with ther ex d/t compensation esp with hip  ex- weakness noted in bilateral hips   PT Next Visit Plan Continue with strengthening for RLE, in ankle and hips.      Patient will benefit from skilled therapeutic intervention in order to improve the following deficits and impairments:  Abnormal gait, Difficulty walking, Decreased range of motion, Decreased strength, Impaired flexibility, Pain  Visit Diagnosis: Difficulty in walking, not elsewhere classified  Right leg weakness  Muscle weakness (generalized)     Problem List Patient Active Problem List   Diagnosis Date Noted  . 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 Right leg weakness 09/04/2015  . Clonus 09/04/2015  . Down syndrome 09/04/2015  . Developmental disability 09/04/2015    Mallorie Norrod,ANGIE PTA 05/13/2016, 11:28 AM  [address] [place], [place], [address] Phone: [phone]   Fax:  [phone]  Name: Aaron Campbell MRN: [id number] Date of Birth: 1981-03-05

## 2016-05-15 ENCOUNTER — Ambulatory Visit: Payer: Commercial Indemnity | Admitting: Physical Therapy

## 2016-05-15 DIAGNOSIS — R262 Difficulty in walking, not elsewhere classified: Secondary | ICD-10-CM

## 2016-05-15 DIAGNOSIS — R29898 Other symptoms and signs involving the musculoskeletal system: Secondary | ICD-10-CM

## 2016-05-15 DIAGNOSIS — M6281 Muscle weakness (generalized): Secondary | ICD-10-CM

## 2016-05-15 NOTE — Therapy (Signed)
[address] [place], [place], [address] Phone: [phone]   Fax:  [phone]  Physical Therapy Treatment  Patient Details  Name: Aaron Campbell MRN: [id number] Date of Birth: 1980-10-23 Referring Provider: Lafe Garin  Encounter Date: 05/15/2016      PT End of Session - 05/15/16 1217    Visit Number 12   Date for PT Re-Evaluation 05/24/16   PT Start Time 6568   PT Stop Time 1230   PT Time Calculation (min) 34 min      Past Medical History:  Diagnosis Date  . Asthma   . Developmental delay   . Hypercholesteremia     Past Surgical History:  Procedure Laterality Date  . no surgical history      There were no vitals filed for this visit.      Subjective Assessment - 05/15/16 1157    Subjective per mom fell 2 times last month,off balacne with RT leg. Limps all the time. Mom reports they need to leave at 12:30 today.   Currently in Pain? No/denies                         [facility] Adult PT Treatment/Exercise - 05/15/16 0001      High Level Balance   High Level Balance Comments alt kicks, side stepping, ball toss with mvmt,directional changes, heel walking toe walking     Knee/Hip Exercises: Aerobic   Nustep L  6 6 min  Bike L 1 6 min     Knee/Hip Exercises: Machines for Strengthening   Cybex Knee Extension 10# 2 sets 10, Rt only 10 times 5#   Cybex Knee Flexion 20# 2 sets 10  15# RT only 10 times   Cybex Leg Press 40# 2 sets 10, 20# 10 times RT     Knee/Hip Exercises: Standing   Hip Flexion Stengthening;10 reps;Right;Left   Hip Flexion Limitations pulley/5#   Hip ADduction Strengthening;Right;10 reps;Left   Hip ADduction Limitations pulley/5#   Hip Abduction Stengthening;Right;10 reps;Left   Abduction Limitations pulley 5#   Hip Extension Stengthening;Right;10 reps;Left   Extension Limitations pulley 5#     Knee/Hip Exercises: Seated   Sit to Sand 20 reps;without  UE support  on airex with wt ball toss                  PT Short Term Goals - 02/27/16 1537      PT SHORT TERM GOAL #1   Title independent with initial HEP   Status Achieved           PT Long Term Goals - 05/13/16 1111      PT LONG TERM GOAL #1   Title increase right DF strength to 4/5   Status Not Met     PT LONG TERM GOAL #2   Title walk 1 mile   Status Achieved     PT LONG TERM GOAL #3   Title increase cervical ROM 25%   Status Achieved     PT LONG TERM GOAL #4   Title decrease trendelenberg gait pattern   Status On-going     PT LONG TERM GOAL #5   Title increase strength of the right quad to 4/5hip to 4/5   Status Achieved               Plan - 05/15/16 1219    Clinical Impression Statement no LOB with balance ex today,LE  weak noted in RT leg ,hip and ankle. Foot drop present esp with fatigue.   PT Next Visit Plan Continue with strengthening for RLE, in ankle and hips. Gait uneven terrain and neg obstacles.      Patient will benefit from skilled therapeutic intervention in order to improve the following deficits and impairments:  Abnormal gait, Difficulty walking, Decreased range of motion, Decreased strength, Impaired flexibility, Pain  Visit Diagnosis: Difficulty in walking, not elsewhere classified  Right leg weakness  Muscle weakness (generalized)     Problem List Patient Active Problem List   Diagnosis Date Noted  . 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 Right leg weakness 09/04/2015  . Clonus 09/04/2015  . Down syndrome 09/04/2015  . Developmental disability 09/04/2015    Kevion Fatheree,ANGIE PTA 05/15/2016, 12:22 PM  [address] [place], [place], [address] Phone: [phone]   Fax:  [phone]  Name: Ashar Lewinski MRN: [id number] Date of Birth: 08-27-1981

## 2016-05-21 ENCOUNTER — Ambulatory Visit: Payer: Commercial Indemnity | Attending: Orthopaedic Surgery | Admitting: Physical Therapy

## 2016-05-21 ENCOUNTER — Encounter: Payer: Self-pay | Admitting: Physical Therapy

## 2016-05-21 DIAGNOSIS — M6281 Muscle weakness (generalized): Secondary | ICD-10-CM | POA: Insufficient documentation

## 2016-05-21 DIAGNOSIS — R29898 Other symptoms and signs involving the musculoskeletal system: Secondary | ICD-10-CM | POA: Insufficient documentation

## 2016-05-21 DIAGNOSIS — R262 Difficulty in walking, not elsewhere classified: Secondary | ICD-10-CM | POA: Insufficient documentation

## 2016-05-21 NOTE — Therapy (Signed)
[facility] [address] [place], [place], [address] Phone: [phone]   Fax:  [phone]  Physical Therapy Treatment  Patient Details  Name: Aaron Campbell MRN: [id number] Date of Birth: [birth year] Referring Provider: Rosezetta Schlatter  Encounter Date: 05/21/2016      PT End of Session - 05/21/16 1425    Visit Number 13   Date for PT Re-Evaluation 05/24/16   PT Start Time 1359   PT Stop Time 1430   PT Time Calculation (min) 31 min   Activity Tolerance Patient tolerated treatment well   Behavior During Therapy [facility] for tasks assessed/performed;Flat affect      Past Medical History:  Diagnosis Date  . Asthma   . Developmental delay   . Hypercholesteremia     Past Surgical History:  Procedure Laterality Date  . no surgical history      There were no vitals filed for this visit.      Subjective Assessment - 05/21/16 1358    Subjective Pt reports that he is good and has not had any falls   Currently in Pain? No/denies   Pain Score 0-No pain            [facility] PT Assessment - 05/21/16 0001      AROM   Overall AROM Comments cervcial ROM WFL, shoulder ROM is WFL's                     [facility] Adult PT Treatment/Exercise - 05/21/16 0001      High Level Balance   High Level Balance Comments standing on airex weighted ball toss x20     Knee/Hip Exercises: Aerobic   Nustep L  6 6 min     Knee/Hip Exercises: Machines for Strengthening   Cybex Knee Extension RLE 5lb 2x10   Cybex Knee Flexion RLE only 15lb 2x10   Cybex Leg Press RLE only 20lb 4x5      Knee/Hip Exercises: Standing   Other Standing Knee Exercises heel raises on black bar x20   Other Standing Knee Exercises green tband DF/PF x20 R only                  PT Short Term Goals - 02/27/16 1537      PT SHORT TERM GOAL #1   Title independent with initial HEP   Status Achieved           PT Long Term Goals - 05/21/16  1405      PT LONG TERM GOAL #2   Title walk 1 mile   Status Achieved     PT LONG TERM GOAL #4   Title decrease trendelenberg gait pattern   Status On-going     PT LONG TERM GOAL #5   Title increase strength of the right quad to 4/5hip to 4/5   Status Achieved               Plan - 05/21/16 1426    Clinical Impression Statement Pt ~ 15 minutes late for today's therapy session. Tolerated RLE isolation interventions well. Pt with some difficulty with single leg leg press. Cues provided during hell raises for pt not to rock.    Rehab Potential Good   PT Frequency 2x / week   PT Duration 4 weeks   PT Treatment/Interventions Moist Heat;Electrical Stimulation;Gait training;Therapeutic activities;Therapeutic exercise;Manual techniques;Patient/family education;Passive range of motion;ADLs/Self Care Home Management   PT Next Visit Plan Continue with strengthening for  RLE, in ankle and hips. Gait uneven terrain and neg obstacles.      Patient will benefit from skilled therapeutic intervention in order to improve the following deficits and impairments:  Abnormal gait, Difficulty walking, Decreased range of motion, Decreased strength, Impaired flexibility, Pain  Visit Diagnosis: Difficulty in walking, not elsewhere classified  Right leg weakness  Muscle weakness (generalized)     Problem List Patient Active Problem List   Diagnosis Date Noted  . 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 Right leg weakness 09/04/2015  . Clonus 09/04/2015  . Down syndrome 09/04/2015  . Developmental disability 09/04/2015    Grayce Sessionsonald G Priyal Musquiz, PTA  05/21/2016, 2:29 PM  [facility] [address] [place], [place], [address] Phone: [phone]   Fax:  [phone]  Name: Aaron Campbell MRN: [id number] Date of Birth: [birth year]

## 2016-05-27 ENCOUNTER — Ambulatory Visit: Payer: Commercial Indemnity | Admitting: Physical Therapy

## 2016-05-29 ENCOUNTER — Ambulatory Visit: Payer: Commercial Indemnity | Admitting: Physical Therapy

## 2016-05-29 ENCOUNTER — Encounter: Payer: Self-pay | Admitting: Physical Therapy

## 2016-05-29 DIAGNOSIS — R262 Difficulty in walking, not elsewhere classified: Secondary | ICD-10-CM | POA: Diagnosis not present

## 2016-05-29 DIAGNOSIS — M6281 Muscle weakness (generalized): Secondary | ICD-10-CM

## 2016-05-29 DIAGNOSIS — R29898 Other symptoms and signs involving the musculoskeletal system: Secondary | ICD-10-CM

## 2016-05-29 NOTE — Therapy (Signed)
[address] [place], [place], [address] Phone: [phone]   Fax:  [phone]  Physical Therapy Treatment  Patient Details  Name: Aaron Campbell MRN: [id number] Date of Birth: October 21, 1980 Referring Provider: Lafe Campbell  Encounter Date: 05/29/2016      PT End of Session - 05/29/16 1225    Visit Number 14   Date for PT Re-Evaluation 05/24/16   PT Start Time 1140   PT Stop Time 1225   PT Time Calculation (min) 45 min   Activity Tolerance Patient tolerated treatment well   Behavior During Therapy [facility] for tasks assessed/performed;Flat affect      Past Medical History:  Diagnosis Date  . Asthma   . Developmental delay   . Hypercholesteremia     Past Surgical History:  Procedure Laterality Date  . no surgical history      There were no vitals filed for this visit.      Subjective Assessment - 05/29/16 1150    Subjective "Good"   Currently in Pain? No/denies   Pain Score 0-No pain                         [facility] Adult PT Treatment/Exercise - 05/29/16 0001      Ambulation/Gait   Stairs Yes   Stairs Assistance 5: Supervision   Stair Management Technique No rails;One rail Left;One rail Right;Alternating pattern;Step to pattern   Number of Stairs 24   Height of Stairs 6   Gait Comments negotiated 2 flights of stair, appears to have decrease awareness of RLE when ascending     High Level Balance   High Level Balance Comments Heel and toe walking, alt ball kicks with directional changes      Knee/Hip Exercises: Aerobic   Nustep L  4 8 min     Knee/Hip Exercises: Machines for Strengthening   Cybex Knee Extension RLE 5lb 2x10   Cybex Knee Flexion RLE only 15lb 2x10   Cybex Leg Press RLE only 20lb 4x5      Knee/Hip Exercises: Standing   Other Standing Knee Exercises Cable push downs 30lb 2x10   Other Standing Knee Exercises green tband DF/PF x20 R only     Knee/Hip  Exercises: Seated   Sit to Sand 20 reps;without UE support  on airex                  PT Short Term Goals - 02/27/16 1537      PT SHORT TERM GOAL #1   Title independent with initial HEP   Status Achieved           PT Long Term Goals - 05/29/16 1229      PT LONG TERM GOAL #1   Title increase right DF strength to 4/5   Status Not Met     PT LONG TERM GOAL #2   Title walk 1 mile   Status Achieved     PT LONG TERM GOAL #3   Title increase cervical ROM 25%   Status Achieved     PT LONG TERM GOAL #4   Title decrease trendelenberg gait pattern   Status On-going     PT LONG TERM GOAL #5   Title increase strength of the right quad to 4/5hip to 4/5   Status Achieved               Plan - 05/29/16 1225    Clinical  Impression Statement Pt appears to have decrease motor control when descending stairs and with cable press downs. Cues requires with cable press downs to perform a controlled motion, pt would often allow LE to turn in or out when performing the movement. Pt unable to maintain true PF when performing the motion against resistance, often time pt R ankle would go into inversion with the movement.    Rehab Potential Good   PT Frequency 2x / week   PT Duration 4 weeks   PT Treatment/Interventions Moist Heat;Electrical Stimulation;Gait training;Therapeutic activities;Therapeutic exercise;Manual techniques;Patient/family education;Passive range of motion;ADLs/Self Care Home Management   PT Next Visit Plan Continue with strengthening for RLE, in ankle and hips. Gait uneven terrain and neg obstacles.      Patient will benefit from skilled therapeutic intervention in order to improve the following deficits and impairments:  Abnormal gait, Difficulty walking, Decreased range of motion, Decreased strength, Impaired flexibility, Pain  Visit Diagnosis: Difficulty in walking, not elsewhere classified  Right leg weakness  Muscle weakness  (generalized)     Problem List Patient Active Problem List   Diagnosis Date Noted  . 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 Right leg weakness 09/04/2015  . Clonus 09/04/2015  . Down syndrome 09/04/2015  . Developmental disability 09/04/2015    Aaron Campbell 05/29/2016, 12:30 PM  [address] [place], [place], [address] Phone: [phone]   Fax:  [phone]  Name: Aaron Campbell MRN: [id number] Date of Birth: 09/09/1981

## 2016-06-09 ENCOUNTER — Ambulatory Visit: Payer: Commercial Indemnity | Admitting: Physical Therapy

## 2016-06-09 ENCOUNTER — Encounter: Payer: Self-pay | Admitting: Physical Therapy

## 2016-06-09 DIAGNOSIS — R29898 Other symptoms and signs involving the musculoskeletal system: Secondary | ICD-10-CM

## 2016-06-09 DIAGNOSIS — R262 Difficulty in walking, not elsewhere classified: Secondary | ICD-10-CM

## 2016-06-09 DIAGNOSIS — M6281 Muscle weakness (generalized): Secondary | ICD-10-CM

## 2016-06-09 NOTE — Therapy (Signed)
[facility] [address] [place], [place], [address] Phone: [phone]   Fax:  [phone]  Physical Therapy Treatment  Patient Details  Name: Aaron Campbell MRN: [id number] Date of Birth: 1981-05-13 Referring Provider: Rosezetta Campbell  Encounter Date: 06/09/2016      PT End of Session - 06/09/16 0925    Visit Number 15   Date for PT Re-Evaluation 05/24/16   PT Start Time 0845   PT Stop Time 0925   PT Time Calculation (min) 40 min   Activity Tolerance Patient tolerated treatment well   Behavior During Therapy [facility] for tasks assessed/performed;Flat affect      Past Medical History:  Diagnosis Date  . Asthma   . Developmental delay   . Hypercholesteremia     Past Surgical History:  Procedure Laterality Date  . no surgical history      There were no vitals filed for this visit.      Subjective Assessment - 06/09/16 0851    Subjective "Good"   Currently in Pain? No/denies            [facility] PT Assessment - 06/09/16 0001      AROM   Overall AROM Comments cervcial ROM WFL, shoulder ROM is WFL's     Strength   Overall Strength Comments UE strength is 4/5 , LE's 5/5, right hip abduction is 5/5 right ankle DF is 4/5                     [facility] Adult PT Treatment/Exercise - 06/09/16 0001      Ambulation/Gait   Ambulation/Gait Yes   Ambulation/Gait Assistance 5: Supervision   Ambulation Distance (Feet) --  >250 ft   Assistive device None   Gait Pattern Step-through pattern;Trendelenburg;Lateral trunk lean to right   Ambulation Surface Outdoor;Unlevel;Paved   Stairs Yes   Stairs Assistance 5: Supervision   Stair Management Technique No rails;One rail Left;One rail Right;Alternating pattern;Step to pattern   Number of Stairs 24   Height of Stairs 6   Gait Comments Negotiated one flight of stairs and walked around building up and down slope     Lumbar Exercises: Machines for Strengthening    Other Lumbar Machine Exercise Rows & Lats 25 2x10     Knee/Hip Exercises: Aerobic   Nustep L  4 8 min     Knee/Hip Exercises: Machines for Strengthening   Cybex Knee Extension RLE 5lb 2x10   Cybex Knee Flexion RLE only 15lb 2x10     Knee/Hip Exercises: Standing   Forward Step Up 1 set;Both;10 reps   Other Standing Knee Exercises Cable push downs 30lb 2x10 RLE                   PT Short Term Goals - 02/27/16 1537      PT SHORT TERM GOAL #1   Title independent with initial HEP   Status Achieved           PT Long Term Goals - 06/09/16 0901      PT LONG TERM GOAL #1   Title increase right DF strength to 4/5   Status Achieved               Plan - 06/09/16 0925    Clinical Impression Statement Pt had progressed completing most of his LTGs. Pt appears to have reached a therapeutic plateau. Trendelenburg gait remains but LE's are stronger. Pt reports he has no functional  limitations and is pleased with his current functional status.   Rehab Potential Good   PT Frequency 2x / week   PT Duration 4 weeks   PT Next Visit Plan D/C      Patient will benefit from skilled therapeutic intervention in order to improve the following deficits and impairments:  Abnormal gait, Difficulty walking, Decreased range of motion, Decreased strength, Impaired flexibility, Pain  Visit Diagnosis: Difficulty in walking, not elsewhere classified  Right leg weakness  Muscle weakness (generalized)     Problem List Patient Active Problem List   Diagnosis Date Noted  . 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 Right leg weakness 09/04/2015  . Clonus 09/04/2015  . Down syndrome 09/04/2015  . Developmental disability 09/04/2015    Aaron Campbell 06/09/2016, 9:29 AM  [facility] [address] [place], [place], [address] Phone: [phone]   Fax:  [phone]  Name: Aaron Campbell MRN:  [id number] Date of Birth: Sep 29, 1980

## 2016-06-11 ENCOUNTER — Ambulatory Visit: Payer: Commercial Indemnity | Admitting: Physical Therapy

## 2016-06-12 ENCOUNTER — Ambulatory Visit: Payer: Commercial Indemnity | Admitting: Physical Therapy

## 2016-06-16 ENCOUNTER — Ambulatory Visit: Payer: Medicaid Other | Admitting: Physical Therapy

## 2016-06-18 ENCOUNTER — Ambulatory Visit: Payer: Medicaid Other | Admitting: Physical Therapy

## 2018-07-07 ENCOUNTER — Other Ambulatory Visit: Payer: Self-pay

## 2018-07-07 ENCOUNTER — Ambulatory Visit (HOSPITAL_COMMUNITY)
Admission: EM | Admit: 2018-07-07 | Discharge: 2018-07-07 | Disposition: A | Discharge status: Home / Self Care | Payer: 59 | Attending: Family Medicine | Admitting: Family Medicine

## 2018-07-07 DIAGNOSIS — L308 Other specified dermatitis: Secondary | ICD-10-CM

## 2018-07-07 MED ORDER — TRIAMCINOLONE ACETONIDE 0.1 % EX CREA: 1.0000 "application " | TOPICAL_CREAM | Freq: Two times a day (BID) | CUTANEOUS | 0 refills | Status: AC

## 2018-07-07 NOTE — ED Triage Notes (Signed)
Pt was seen by PCP on October 7.  They were given Rx for eczema at that time and given a referral to dermatology.  No appt has been set at this time.    Pt was using Eucerin cream but was switched to Triamcinolone cream during his appt.  The prescription was never received by the pharmacy and he is having an outbreak on his chest.

## 2018-07-07 NOTE — Discharge Instructions (Signed)
Triamcinolone cream prescribed.  Used as directed for symptomatic relief Avoid hot showers.  Moisturize skin daily Follow up with PCP for reevaluation and management of chronic eczema Return or go to the ER if you have any new or worsening symptoms

## 2018-07-07 NOTE — ED Provider Notes (Signed)
[facility]   [id number] 07/07/18 Arrival Time: 1352  CC:  Eczema   SUBJECTIVE:  Aaron Campbell is a 37 y.o. male who presents with a eczema.  Patient has had eczema for years.  Was seen by PCP on 06/21/18 and switched from Eucerin cream to triamcinolone, but never picked up prescription.  Here today to get medication filled.  Localizes the rash to chest.  Describes it as mildly itchy, but not painful.  Denies fever, chills, nausea, vomiting, erythema, redness, swollen glands, SOB, chest pain, abdominal pain, changes in bowel or bladder function.    ROS: As per HPI.  Past Medical History:  Diagnosis Date  . Asthma   . Developmental delay   . Hypercholesteremia    Past Surgical History:  Procedure Laterality Date  . no surgical history     Allergies  Allergen Reactions  . Sulfa Antibiotics     Family hx   No current facility-administered medications on file prior to encounter.    Current Outpatient Medications on File Prior to Encounter  Medication Sig Dispense Refill  . pravastatin (PRAVACHOL) 40 MG tablet Take 40 mg by mouth daily. Reported on 09/04/2015    . doxycycline (VIBRA-TABS) 100 MG tablet Take 100 mg by mouth 2 (two) times daily. Reported on 09/04/2015    . methocarbamol (ROBAXIN) 500 MG tablet Take 500 mg by mouth every 6 (six) hours as needed.  0   Social History   Socioeconomic History  . Marital status: Single    Spouse name: Not on file  . Number of children: Not on file  . Years of education: Not on file  . Highest education level: Not on file  Occupational History  . Occupation: Disabled  Social Needs  . Financial resource strain: Not on file  . Food insecurity:    Worry: Not on file    Inability: Not on file  . Transportation needs:    Medical: Not on file    Non-medical: Not on file  Tobacco Use  . Smoking status: Never Smoker  Substance and Sexual Activity  . Alcohol use: Yes    Alcohol/week: 0.0 standard drinks    Comment: 1 per  week  . Drug use: No  . Sexual activity: Not on file  Lifestyle  . Physical activity:    Days per week: Not on file    Minutes per session: Not on file  . Stress: Not on file  Relationships  . Social connections:    Talks on phone: Not on file    Gets together: Not on file    Attends religious service: Not on file    Active member of club or organization: Not on file    Attends meetings of clubs or organizations: Not on file    Relationship status: Not on file  . Intimate partner violence:    Fear of current or ex partner: Not on file    Emotionally abused: Not on file    Physically abused: Not on file    Forced sexual activity: Not on file  Other Topics Concern  . Not on file  Social History Narrative   Lives with parents   Caffeine use: Tea   Family History  Problem Relation Age of Onset  . Stroke Neg Hx   . Neuropathy Neg Hx     OBJECTIVE: Vitals:   07/07/18 1411  BP: 123/76  Pulse: 92  Resp: 18  Temp: 97.9 F (36.6 C)  SpO2: 97%  General appearance: alert; no distress Lungs: clear to auscultation bilaterally Heart: regular rate and rhythm.  Radial pulse 2+ bilaterally Extremities: no edema Skin: warm and dry; erythematous papular eczematous rash localized to anterior chest; nontender to palpation; no active drainage or bleeding Psychological: alert and cooperative; normal mood and affect  ASSESSMENT & PLAN:  1. Other eczema     Meds ordered this encounter  Medications  . triamcinolone cream (KENALOG) 0.1 %    Sig: Apply 1 application topically 2 (two) times daily.    Dispense:  50 g    Refill:  0    Order Specific Question:   Supervising Provider    Answer:   Isa Rankin [[id number]]   Triamcinolone cream prescribed.  Use as directed for symptomatic relief Avoid hot showers.  Moisturize skin daily Follow up with PCP for reevaluation and management of chronic eczema Return or go to the ER if you have any new or worsening  symptoms  Reviewed expectations re: course of current medical issues. Questions answered. Outlined signs and symptoms indicating need for more acute intervention. Patient verbalized understanding. After Visit Summary given.   Rennis Harding, PA-C 07/07/18 1459

## 2019-01-31 ENCOUNTER — Ambulatory Visit
Admission: RE | Admit: 2019-01-31 | Discharge: 2019-01-31 | Disposition: A | Discharge status: Home / Self Care | Payer: 59 | Source: Ambulatory Visit | Attending: Family Medicine | Admitting: Family Medicine

## 2019-01-31 ENCOUNTER — Other Ambulatory Visit: Payer: Self-pay

## 2019-01-31 ENCOUNTER — Other Ambulatory Visit: Payer: Self-pay | Admitting: Family Medicine

## 2019-01-31 DIAGNOSIS — G8929 Other chronic pain: Secondary | ICD-10-CM

## 2019-01-31 DIAGNOSIS — M542 Cervicalgia: Secondary | ICD-10-CM

## 2020-11-19 ENCOUNTER — Ambulatory Visit: Payer: 59 | Admitting: Podiatry

## 2021-10-14 ENCOUNTER — Ambulatory Visit: Payer: 59 | Admitting: Podiatry

## 2023-05-07 ENCOUNTER — Other Ambulatory Visit: Payer: Self-pay

## 2023-05-07 ENCOUNTER — Emergency Department (HOSPITAL_COMMUNITY)
Admission: EM | Admit: 2023-05-07 | Discharge: 2023-05-07 | Disposition: A | Discharge status: Home / Self Care | Payer: 59 | Attending: Emergency Medicine | Admitting: Emergency Medicine

## 2023-05-07 ENCOUNTER — Encounter (HOSPITAL_COMMUNITY): Payer: Self-pay

## 2023-05-07 DIAGNOSIS — D72819 Decreased white blood cell count, unspecified: Secondary | ICD-10-CM | POA: Insufficient documentation

## 2023-05-07 DIAGNOSIS — R55 Syncope and collapse: Secondary | ICD-10-CM | POA: Insufficient documentation

## 2023-05-07 DIAGNOSIS — R42 Dizziness and giddiness: Secondary | ICD-10-CM | POA: Diagnosis present

## 2023-05-07 LAB — COMPREHENSIVE METABOLIC PANEL
ALT: 18 U/L (ref 0–44)
AST: 35 U/L (ref 15–41)
Albumin: 3.4 g/dL — ABNORMAL LOW (ref 3.5–5.0)
Alkaline Phosphatase: 47 U/L (ref 38–126)
Anion gap: 10 (ref 5–15)
BUN: 10 mg/dL (ref 6–20)
CO2: 25 mmol/L (ref 22–32)
Calcium: 8.3 mg/dL — ABNORMAL LOW (ref 8.9–10.3)
Chloride: 103 mmol/L (ref 98–111)
Creatinine, Ser: 0.96 mg/dL (ref 0.61–1.24)
GFR, Estimated: >60 mL/min (ref >60)
Glucose, Bld: 83 mg/dL (ref 70–99)
Potassium: 3.9 mmol/L (ref 3.5–5.1)
Sodium: 138 mmol/L (ref 135–145)
Total Bilirubin: 1.4 mg/dL — ABNORMAL HIGH (ref 0.3–1.2)
Total Protein: 6.7 g/dL (ref 6.5–8.1)

## 2023-05-07 LAB — CBC WITH DIFFERENTIAL/PLATELET
Abs Immature Granulocytes: 0.01 10*3/uL (ref 0.00–0.07)
Basophils Absolute: 0 10*3/uL (ref 0.0–0.1)
Basophils Relative: 1 %
Eosinophils Absolute: 0.1 10*3/uL (ref 0.0–0.5)
Eosinophils Relative: 2 %
HCT: 45.9 % (ref 39.0–52.0)
Hemoglobin: 15.2 g/dL (ref 13.0–17.0)
Immature Granulocytes: 0 %
Lymphocytes Relative: 26 %
Lymphs Abs: 0.9 10*3/uL (ref 0.7–4.0)
MCH: 29.9 pg (ref 26.0–34.0)
MCHC: 33.1 g/dL (ref 30.0–36.0)
MCV: 90.4 fL (ref 80.0–100.0)
Monocytes Absolute: 0.4 10*3/uL (ref 0.1–1.0)
Monocytes Relative: 10 %
Neutro Abs: 2.2 10*3/uL (ref 1.7–7.7)
Neutrophils Relative %: 61 %
Platelets: 167 10*3/uL (ref 150–400)
RBC: 5.08 MIL/uL (ref 4.22–5.81)
RDW: 14.4 % (ref 11.5–15.5)
WBC: 3.5 10*3/uL — ABNORMAL LOW (ref 4.0–10.5)
nRBC: 0 % (ref 0.0–0.2)

## 2023-05-07 MED ORDER — LACTATED RINGERS IV BOLUS
1000.0000 mL | Freq: Once | INTRAVENOUS | Status: AC
  Administered 2023-05-07: 1000 mL via INTRAVENOUS

## 2023-05-07 NOTE — Discharge Instructions (Signed)
Be sure to drink plenty of fluids and eat appropriately. Get up slowly to make sure you're not dizzy.  If you develop headache, chest pain, dizziness, or any other new/concerning symptoms, then return to the ER or call 911.  Otherwise, follow up closely with your primary care doctor.

## 2023-05-07 NOTE — ED Triage Notes (Signed)
Patient bib [facility] from [facility] after a syncopal episode this morning. On arrival to scene EMS states that patients standing BP was 80's systolic with no radial pulses, but laying down BP was 110/70. Patient is only A&Ox1 and forgetful at this time.

## 2023-05-07 NOTE — ED Provider Notes (Signed)
[place] EMERGENCY DEPARTMENT AT [facility] Provider Note   CSN: [id number] Arrival date & time: 05/07/23  0254     History  Chief Complaint  Patient presents with   Loss of Consciousness    Aaron Campbell is a 42 y.o. male.  HPI 42 year old male with a history of Down syndrome presents after syncope at the group home.  Patient reports to me that he felt dizzy when he woke up.  He is no longer feeling dizzy.  EMS reported to the nurse that the patient had multiple syncopal episodes including in the ambulance.  He was orthostatic with them, blood pressure dropping into the 80s when he stood up with a thready pulse.  Patient denies any headache, chest pain, shortness of breath.  It is unclear whether or not he hit his head. No recent illness.  Home Medications Prior to Admission medications   Medication Sig Start Date End Date Taking? Authorizing Provider  doxycycline (VIBRA-TABS) 100 MG tablet Take 100 mg by mouth 2 (two) times daily. Reported on 09/04/2015    [provider]  methocarbamol (ROBAXIN) 500 MG tablet Take 500 mg by mouth every 6 (six) hours as needed. 05/29/15   [provider]  pravastatin (PRAVACHOL) 40 MG tablet Take 40 mg by mouth daily. Reported on 09/04/2015    [provider]  triamcinolone cream (KENALOG) 0.1 % Apply 1 application topically 2 (two) times daily. 07/07/18   Wurst, [place], PA-C      Allergies    Sulfa antibiotics    Review of Systems   Review of Systems  Respiratory:  Negative for shortness of breath.   Cardiovascular:  Negative for chest pain and palpitations.  Gastrointestinal:  Negative for abdominal pain.  Neurological:  Positive for dizziness and syncope. Negative for headaches.    Physical Exam Updated Vital Signs BP 108/86   Pulse 74   Temp (!) 97.4 F (36.3 C) (Oral)   Resp 18   Ht 5\' 3"  (1.6 m)   Wt 87 kg   SpO2 100%   BMI 33.98 kg/m  Physical Exam Vitals and nursing note reviewed.   Constitutional:      Appearance: He is well-developed.  HENT:     Head: Normocephalic and atraumatic.  Eyes:     Pupils: Pupils are equal, round, and reactive to light.  Cardiovascular:     Rate and Rhythm: Normal rate and regular rhythm.     Heart sounds: Normal heart sounds.  Pulmonary:     Effort: Pulmonary effort is normal.     Breath sounds: Normal breath sounds.  Abdominal:     Palpations: Abdomen is soft.     Tenderness: There is no abdominal tenderness.  Skin:    General: Skin is warm and dry.  Neurological:     Mental Status: He is alert.     Comments: CN 3-12 grossly intact. 5/5 strength in all 4 extremities. Grossly normal sensation. Normal finger to nose.      ED Results / Procedures / Treatments   Labs (all labs ordered are listed, but only abnormal results are displayed) Labs Reviewed  COMPREHENSIVE METABOLIC PANEL - Abnormal; Notable for the following components:      Result Value   Calcium 8.3 (*)    Albumin 3.4 (*)    Total Bilirubin 1.4 (*)    All other components within normal limits  CBC WITH DIFFERENTIAL/PLATELET - Abnormal; Notable for the following components:   WBC 3.5 (*)  All other components within normal limits    EKG EKG Interpretation Date/Time:  Thursday May 07 2023 09:49:24 EDT Ventricular Rate:  68 PR Interval:  155 QRS Duration:  80 QT Interval:  409 QTC Calculation: 435 R Axis:   61  Text Interpretation: Sinus rhythm nonspecific flat T waves. Confirmed by Pricilla Loveless [phone]) on 05/07/2023 9:53:49 AM  Radiology No results found.  Procedures Procedures    Medications Ordered in ED Medications  lactated ringers bolus 1,000 mL (0 mLs Intravenous Stopped 05/07/23 1230)    ED Course/ Medical Decision Making/ A&P                                 Medical Decision Making Amount and/or Complexity of Data Reviewed Independent Historian: parent Labs: ordered.    Details: Minimal leukopenia. ECG/medicine tests:  ordered and independent interpretation performed.    Details: No ischemia/arrhythmia.   Patient is back to baseline on arrival to the ED.  Mom arrived just after patient.  She did not witness this as this occurred at the group home.  However it seems like the patient did not have seizure like activity or head injury. No headache.  Given no neurofindings I think CT head is not necessary.  At this point he is feeling well.  His orthostatics show no signs of orthostatic hypotension and he did not have any recurrent dizziness.  He is eating and drinking well.  Will discharge home to follow-up with PCP.  Discussed return precautions with parents.        Final Clinical Impression(s) / ED Diagnoses Final diagnoses:  Syncope and collapse    Rx / DC Orders ED Discharge Orders     None         Pricilla Loveless, MD 05/07/23 [phone]

## 2023-06-09 ENCOUNTER — Ambulatory Visit: Payer: 59 | Admitting: Podiatry

## 2023-06-10 ENCOUNTER — Encounter: Payer: Self-pay | Admitting: Podiatry

## 2023-06-10 ENCOUNTER — Ambulatory Visit (INDEPENDENT_AMBULATORY_CARE_PROVIDER_SITE_OTHER): Payer: 59 | Admitting: Podiatry

## 2023-06-10 VITALS — BP 113/71 | HR 83

## 2023-06-10 DIAGNOSIS — B351 Tinea unguium: Secondary | ICD-10-CM

## 2023-06-10 DIAGNOSIS — D492 Neoplasm of unspecified behavior of bone, soft tissue, and skin: Secondary | ICD-10-CM | POA: Diagnosis not present

## 2023-06-10 NOTE — Progress Notes (Signed)
Subjective:   Patient ID: Aaron Campbell, male   DOB: 42 y.o.   MRN: [id number]   HPI Patient presents with caregiver with lesion of a chronic nature bilateral partner for the history of fungus toenail and does get them caught but difficult to do and pain with ambulation.  Patient does not smoke tries to be active   Review of Systems  All other systems reviewed and are negative.       Objective:  Physical Exam Vitals and nursing note reviewed.  Constitutional:      Appearance: He is well-developed.  Pulmonary:     Effort: Pulmonary effort is normal.  Musculoskeletal:        General: Normal range of motion.  Skin:    General: Skin is warm.  Neurological:     Mental Status: He is alert.     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

## 2023-12-02 ENCOUNTER — Encounter: Payer: Self-pay | Admitting: Podiatry

## 2023-12-02 ENCOUNTER — Ambulatory Visit (INDEPENDENT_AMBULATORY_CARE_PROVIDER_SITE_OTHER): Admitting: Podiatry

## 2023-12-02 DIAGNOSIS — D492 Neoplasm of unspecified behavior of bone, soft tissue, and skin: Secondary | ICD-10-CM | POA: Diagnosis not present

## 2023-12-02 NOTE — Progress Notes (Signed)
 Subjective:   Patient ID: Aaron Campbell, male   DOB: 43 y.o.   MRN: [id number]   HPI Patient presents with severe lesions plantar aspect both feet that are very hard to walk on stating the medication we used at last visit did help for a period of time   ROS      Objective:  Physical Exam  Neurovascular status intact severe lesions plantar aspect both feet that upon debridement show pinpoint bleeding pain to lateral pressure     Assessment:  Probability for verruca plantaris plantar aspect left and right with benign neoplasm formation     Plan:  Sterile sharp debridement of lesions applied chemical agent to create immune response with sterile over dressing explained what to do if blistering were to occur reappoint as needed

## 2024-03-02 ENCOUNTER — Ambulatory Visit (INDEPENDENT_AMBULATORY_CARE_PROVIDER_SITE_OTHER): Admitting: Podiatry

## 2024-03-02 ENCOUNTER — Encounter: Payer: Self-pay | Admitting: Podiatry

## 2024-03-02 DIAGNOSIS — M79675 Pain in left toe(s): Secondary | ICD-10-CM | POA: Diagnosis not present

## 2024-03-02 DIAGNOSIS — B351 Tinea unguium: Secondary | ICD-10-CM | POA: Diagnosis not present

## 2024-03-02 DIAGNOSIS — M79674 Pain in right toe(s): Secondary | ICD-10-CM | POA: Diagnosis not present

## 2024-03-03 NOTE — Progress Notes (Signed)
 Subjective:   Patient ID: Aaron Campbell, male   DOB: 43 y.o.   MRN: [id number]   HPI Patient presents with severe nail disease 1-5 both feet that are thick and he cannot take care of and they are painful with shoe gear   ROS      Objective:  Physical Exam  Neurovascular status intact with thick yellow brittle nailbeds 1-5 both feet painful     Assessment:  Mycotic nail infection with pain 1-5 both feet today     Plan:  Debridement painful nailbeds 1-5 both feet neurogenic bleeding reappoint routine care

## 2024-03-22 ENCOUNTER — Other Ambulatory Visit: Payer: Self-pay | Admitting: Family Medicine

## 2024-03-22 ENCOUNTER — Ambulatory Visit
Admission: RE | Admit: 2024-03-22 | Discharge: 2024-03-22 | Disposition: A | Discharge status: Home / Self Care | Source: Ambulatory Visit | Attending: Family Medicine | Admitting: Family Medicine

## 2024-03-22 ENCOUNTER — Encounter: Payer: Self-pay | Admitting: Family Medicine

## 2024-03-22 ENCOUNTER — Ambulatory Visit

## 2024-03-22 DIAGNOSIS — R2689 Other abnormalities of gait and mobility: Secondary | ICD-10-CM

## 2024-03-22 DIAGNOSIS — M545 Low back pain, unspecified: Secondary | ICD-10-CM

## 2024-03-22 DIAGNOSIS — M542 Cervicalgia: Secondary | ICD-10-CM

## 2024-06-08 ENCOUNTER — Encounter: Payer: Self-pay | Admitting: Podiatry

## 2024-06-08 ENCOUNTER — Ambulatory Visit (INDEPENDENT_AMBULATORY_CARE_PROVIDER_SITE_OTHER): Admitting: Podiatry

## 2024-06-08 DIAGNOSIS — M79674 Pain in right toe(s): Secondary | ICD-10-CM

## 2024-06-08 DIAGNOSIS — M79675 Pain in left toe(s): Secondary | ICD-10-CM | POA: Diagnosis not present

## 2024-06-08 DIAGNOSIS — B351 Tinea unguium: Secondary | ICD-10-CM

## 2024-06-09 NOTE — Progress Notes (Signed)
 Subjective:   Patient ID: Aaron Campbell, male   DOB: 43 y.o.   MRN: [id number]   HPI Patient presents with caregiver with painful nails 1-5 both feet that are thick dystrophic and impossible for them to cut   ROS      Objective:  Physical Exam  Neurovascular status intact with thickened dystrophic nailbeds 1-5 both feet painful when pressed     Assessment:  Chronic mycotic nail infection currently with pain 1-5 both feet     Plan:  Debridement of painful nailbeds 1-5 both feet neurogenic bleeding reappoint routine care

## 2024-09-05 ENCOUNTER — Ambulatory Visit: Admitting: Podiatry

## 2024-09-05 ENCOUNTER — Encounter: Payer: Self-pay | Admitting: Podiatry

## 2024-09-05 ENCOUNTER — Ambulatory Visit (INDEPENDENT_AMBULATORY_CARE_PROVIDER_SITE_OTHER): Admitting: Podiatry

## 2024-09-05 DIAGNOSIS — B351 Tinea unguium: Secondary | ICD-10-CM

## 2024-09-05 DIAGNOSIS — M79675 Pain in left toe(s): Secondary | ICD-10-CM

## 2024-09-05 DIAGNOSIS — M79674 Pain in right toe(s): Secondary | ICD-10-CM

## 2024-09-05 DIAGNOSIS — D492 Neoplasm of unspecified behavior of bone, soft tissue, and skin: Secondary | ICD-10-CM

## 2024-09-05 NOTE — Progress Notes (Signed)
 Subjective:   Patient ID: Aaron Campbell, male   DOB: 43 y.o.   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

## 2024-12-05 ENCOUNTER — Ambulatory Visit: Admitting: Podiatry
